# Patient Record
Sex: Female | Born: 1960 | Race: Black or African American | Hispanic: No | Marital: Married | Smoking: Never smoker
Health system: Southern US, Community
[De-identification: ages and names within clinical notes are randomized; demographics above are authoritative.]

## PROBLEM LIST (undated history)

## (undated) DIAGNOSIS — J40 Bronchitis, not specified as acute or chronic: Secondary | ICD-10-CM

## (undated) HISTORY — PX: ABDOMINAL HYSTERECTOMY: SHX81

## (undated) HISTORY — PX: KNEE SURGERY: SHX244

---

## 2013-04-25 ENCOUNTER — Encounter (HOSPITAL_COMMUNITY): Payer: Self-pay | Admitting: Emergency Medicine

## 2013-04-25 ENCOUNTER — Emergency Department (INDEPENDENT_AMBULATORY_CARE_PROVIDER_SITE_OTHER)
Admission: EM | Admit: 2013-04-25 | Discharge: 2013-04-25 | Disposition: A | Discharge status: Home / Self Care | Payer: Self-pay | Source: Home / Self Care | Attending: Family Medicine | Admitting: Family Medicine

## 2013-04-25 ENCOUNTER — Emergency Department (INDEPENDENT_AMBULATORY_CARE_PROVIDER_SITE_OTHER): Payer: Self-pay

## 2013-04-25 DIAGNOSIS — J45909 Unspecified asthma, uncomplicated: Secondary | ICD-10-CM

## 2013-04-25 MED ORDER — AZITHROMYCIN 250 MG PO TABS: ORAL_TABLET | ORAL | Status: DC

## 2013-04-25 MED ORDER — ACETAMINOPHEN 325 MG PO TABS
ORAL_TABLET | ORAL | Status: AC
  Filled 2013-04-25: qty 2

## 2013-04-25 MED ORDER — IPRATROPIUM BROMIDE 0.02 % IN SOLN
0.5000 mg | Freq: Once | RESPIRATORY_TRACT | Status: AC
  Administered 2013-04-25: 0.5 mg via RESPIRATORY_TRACT

## 2013-04-25 MED ORDER — PREDNISONE 20 MG PO TABS
ORAL_TABLET | ORAL | Status: AC
  Filled 2013-04-25: qty 2

## 2013-04-25 MED ORDER — AZITHROMYCIN 250 MG PO TABS
ORAL_TABLET | ORAL | Status: AC
  Filled 2013-04-25: qty 2

## 2013-04-25 MED ORDER — PREDNISONE 10 MG PO TABS: ORAL_TABLET | ORAL | Status: DC

## 2013-04-25 MED ORDER — ALBUTEROL SULFATE HFA 108 (90 BASE) MCG/ACT IN AERS: 1.0000 | INHALATION_SPRAY | RESPIRATORY_TRACT | Status: DC | PRN

## 2013-04-25 MED ORDER — ACETAMINOPHEN 325 MG PO TABS
650.0000 mg | ORAL_TABLET | Freq: Once | ORAL | Status: AC
  Administered 2013-04-25: 650 mg via ORAL

## 2013-04-25 MED ORDER — ALBUTEROL SULFATE (2.5 MG/3ML) 0.083% IN NEBU
5.0000 mg | INHALATION_SOLUTION | Freq: Once | RESPIRATORY_TRACT | Status: AC
Start: 2013-04-25 — End: 2013-04-25
  Administered 2013-04-25: 5 mg via RESPIRATORY_TRACT

## 2013-04-25 MED ORDER — IPRATROPIUM BROMIDE 0.02 % IN SOLN
RESPIRATORY_TRACT | Status: AC
  Filled 2013-04-25: qty 2.5

## 2013-04-25 MED ORDER — PREDNISONE 20 MG PO TABS
40.0000 mg | ORAL_TABLET | Freq: Once | ORAL | Status: AC
  Administered 2013-04-25: 40 mg via ORAL

## 2013-04-25 MED ORDER — ALBUTEROL SULFATE (2.5 MG/3ML) 0.083% IN NEBU
INHALATION_SOLUTION | RESPIRATORY_TRACT | Status: AC
  Filled 2013-04-25: qty 6

## 2013-04-25 MED ORDER — IPRATROPIUM BROMIDE 0.06 % NA SOLN: 2.0000 | Freq: Four times a day (QID) | NASAL | Status: DC

## 2013-04-25 MED ORDER — AZITHROMYCIN 250 MG PO TABS
500.0000 mg | ORAL_TABLET | Freq: Once | ORAL | Status: AC
  Administered 2013-04-25: 500 mg via ORAL

## 2013-04-25 NOTE — ED Notes (Signed)
Went to get pt for CXR, was receiving breathing treatment

## 2013-04-25 NOTE — ED Notes (Signed)
C/o URI type symptoms since 1st week in month, cough, wheezing, , rumbling in her chest. Minimal relief w OTC medications

## 2013-04-25 NOTE — ED Notes (Signed)
Breathing treatment continues. 

## 2013-04-25 NOTE — ED Provider Notes (Signed)
CSN: [id number]     Arrival date & time 04/25/13  [id number] History   First MD Initiated Contact with Patient 04/25/13 [phone]     Chief Complaint  Patient presents with  . URI   (Consider location/radiation/quality/duration/timing/severity/associated sxs/prior Treatment) HPI Comments: Non-smoker PCP: none Works in Product/process development scientistmechandising  Patient is a 53 y.o. female presenting with URI. The history is provided by the patient.  URI Presenting symptoms: congestion, cough, fatigue and rhinorrhea   Severity:  Moderate Onset quality:  Gradual Duration:  2 weeks Timing:  Constant Progression:  Worsening Chronicity:  New Associated symptoms: wheezing   Associated symptoms: no arthralgias, no headaches, no myalgias, no neck pain, no sinus pain and no sneezing   Associated symptoms comment:  +dyspnea   History reviewed. No pertinent past medical history. Past Surgical History  Procedure Laterality Date  . Abdominal hysterectomy    . Knee surgery     History reviewed. No pertinent family history. History  Substance Use Topics  . Smoking status: Never Smoker   . Smokeless tobacco: Not on file  . Alcohol Use: No   OB History   Grav Para Term Preterm Abortions TAB SAB Ect Mult Living                 Review of Systems  Constitutional: Positive for chills and fatigue.  HENT: Positive for congestion and rhinorrhea. Negative for sneezing.   Eyes: Negative.   Respiratory: Positive for cough, shortness of breath and wheezing.   Cardiovascular: Negative.   Gastrointestinal: Negative.   Genitourinary: Negative.   Musculoskeletal: Negative for arthralgias, myalgias and neck pain.  Neurological: Negative for headaches.    Allergies  Sulfa antibiotics  Home Medications   Current Outpatient Rx  Name  Route  Sig  Dispense  Refill  . albuterol (PROVENTIL HFA;VENTOLIN HFA) 108 (90 BASE) MCG/ACT inhaler   Inhalation   Inhale 1-2 puffs into the lungs every 4 (four) hours as needed for wheezing or  shortness of breath.   1 Inhaler   0   . azithromycin (ZITHROMAX) 250 MG tablet      Beginning 04/26/2013, take 1 po QD x 4 days   4 tablet   0   . predniSONE (DELTASONE) 10 MG tablet      Beginning 04/26/2013, Take 3 po QD day 1, 2 tabs po QD day 2, 1 tab po QD days 3 & 4 then stop.   7 tablet   0    BP 183/94  Pulse 102  Temp(Src) 100.6 F (38.1 C) (Oral)  Resp 22  SpO2 93% Physical Exam  Nursing note and vitals reviewed. Constitutional: She is oriented to person, place, and time. She appears well-developed and well-nourished. No distress.  HENT:  Head: Normocephalic and atraumatic.  Right Ear: Hearing, tympanic membrane, external ear and ear canal normal.  Left Ear: Hearing, tympanic membrane, external ear and ear canal normal.  Nose: Nose normal.  Mouth/Throat: Uvula is midline, oropharynx is clear and moist and mucous membranes are normal.  Eyes: Conjunctivae are normal.  Neck: Neck supple.  Cardiovascular: Normal rate, regular rhythm and normal heart sounds.   Pulmonary/Chest: She has decreased breath sounds. She has wheezes. She has no rhonchi. She has no rales.  Musculoskeletal: Normal range of motion.  Lymphadenopathy:    She has no cervical adenopathy.  Neurological: She is alert and oriented to person, place, and time.  Skin: Skin is warm and dry. No rash noted.  Psychiatric: She has a normal mood  and affect. Her behavior is normal.    ED Course  Procedures (including critical care time) Labs Review Labs Reviewed - No data to display Imaging Review Dg Chest 2 View  04/25/2013   CLINICAL DATA:  Upper respiratory infection. Shortness of breath, wheezing, chest congestion, dizziness, and fever.  EXAM: CHEST  2 VIEW  COMPARISON:  None.  FINDINGS: The cardiomediastinal silhouette is within normal limits. The lungs are well inflated without evidence of focal airspace consolidation, edema, pleural effusion, or pneumothorax. No acute osseous abnormality is  identified.  IMPRESSION: No active cardiopulmonary disease.   Electronically Signed   By: Sebastian Ache   On: 04/25/2013 11:38     MDM   1. Asthmatic bronchitis    Asthmatic Bronchitis: No CAP. Will treat with bronchodilators, 5 day course of azithromycin, and short course of prednisone. Patient advised regarding elevated blood pressure and that this should be re-checked by the PCP of her choice in 7-10 days. Maintain good hydration and tylenol as directed on packaging for aches/fever. Cautioned about need to return if no improvement in increased work of breathing.  Patient reports significant reduction in wheezing and work of breathing following albuterol/atrovent neb at [facility]. Requests assistance in the form of a prescription for excessive post nasal drainage. Will provide Rx for atrovent nasal spray.   Jess Barters [place], [place] 04/25/13 [address] [place], [place] 04/25/13 [phone]

## 2013-04-25 NOTE — Discharge Instructions (Signed)

## 2013-04-26 ENCOUNTER — Encounter (HOSPITAL_COMMUNITY): Payer: Self-pay | Admitting: Emergency Medicine

## 2013-04-26 ENCOUNTER — Emergency Department (HOSPITAL_COMMUNITY)
Admission: EM | Admit: 2013-04-26 | Discharge: 2013-04-26 | Disposition: A | Discharge status: Home / Self Care | Payer: Self-pay | Attending: Emergency Medicine | Admitting: Emergency Medicine

## 2013-04-26 ENCOUNTER — Emergency Department (HOSPITAL_COMMUNITY): Payer: Self-pay

## 2013-04-26 DIAGNOSIS — R1110 Vomiting, unspecified: Secondary | ICD-10-CM | POA: Insufficient documentation

## 2013-04-26 DIAGNOSIS — R42 Dizziness and giddiness: Secondary | ICD-10-CM | POA: Insufficient documentation

## 2013-04-26 DIAGNOSIS — J209 Acute bronchitis, unspecified: Secondary | ICD-10-CM

## 2013-04-26 DIAGNOSIS — J3489 Other specified disorders of nose and nasal sinuses: Secondary | ICD-10-CM | POA: Insufficient documentation

## 2013-04-26 DIAGNOSIS — R0602 Shortness of breath: Secondary | ICD-10-CM

## 2013-04-26 DIAGNOSIS — R509 Fever, unspecified: Secondary | ICD-10-CM | POA: Insufficient documentation

## 2013-04-26 HISTORY — DX: Bronchitis, not specified as acute or chronic: J40

## 2013-04-26 LAB — CBC
HEMATOCRIT: 38.4 % (ref 36.0–46.0)
Hemoglobin: 13.1 g/dL (ref 12.0–15.0)
MCH: 30.8 pg (ref 26.0–34.0)
MCHC: 34.1 g/dL (ref 30.0–36.0)
MCV: 90.1 fL (ref 78.0–100.0)
Platelets: 247 10*3/uL (ref 150–400)
RBC: 4.26 MIL/uL (ref 3.87–5.11)
RDW: 14.2 % (ref 11.5–15.5)
WBC: 5 10*3/uL (ref 4.0–10.5)

## 2013-04-26 LAB — BASIC METABOLIC PANEL
BUN: 17 mg/dL (ref 6–23)
CHLORIDE: 100 meq/L (ref 96–112)
CO2: 24 mEq/L (ref 19–32)
Calcium: 9.3 mg/dL (ref 8.4–10.5)
Creatinine, Ser: 0.95 mg/dL (ref 0.50–1.10)
GFR calc Af Amer: 77 mL/min — ABNORMAL LOW (ref >90)
GFR calc non Af Amer: 67 mL/min — ABNORMAL LOW (ref >90)
GLUCOSE: 133 mg/dL — AB (ref 70–99)
POTASSIUM: 4 meq/L (ref 3.7–5.3)
Sodium: 139 mEq/L (ref 137–147)

## 2013-04-26 LAB — PRO B NATRIURETIC PEPTIDE: Pro B Natriuretic peptide (BNP): 89.5 pg/mL (ref 0–125)

## 2013-04-26 LAB — D-DIMER, QUANTITATIVE (NOT AT ARMC): D DIMER QUANT: 1.58 ug{FEU}/mL — AB (ref 0.00–0.48)

## 2013-04-26 LAB — I-STAT TROPONIN, ED: Troponin i, poc: 0.02 ng/mL (ref 0.00–0.08)

## 2013-04-26 MED ORDER — ACETAMINOPHEN 325 MG PO TABS
650.0000 mg | ORAL_TABLET | Freq: Once | ORAL | Status: AC
  Administered 2013-04-26: 650 mg via ORAL
  Filled 2013-04-26: qty 2

## 2013-04-26 MED ORDER — ALBUTEROL SULFATE (2.5 MG/3ML) 0.083% IN NEBU
5.0000 mg | INHALATION_SOLUTION | Freq: Once | RESPIRATORY_TRACT | Status: AC
  Administered 2013-04-26: 5 mg via RESPIRATORY_TRACT
  Filled 2013-04-26: qty 6

## 2013-04-26 MED ORDER — HYDROCOD POLST-CHLORPHEN POLST 10-8 MG/5ML PO LQCR
5.0000 mL | Freq: Once | ORAL | Status: AC
  Administered 2013-04-26: 5 mL via ORAL
  Filled 2013-04-26: qty 5

## 2013-04-26 MED ORDER — LEVALBUTEROL HCL 0.63 MG/3ML IN NEBU
0.6300 mg | INHALATION_SOLUTION | Freq: Once | RESPIRATORY_TRACT | Status: AC
  Administered 2013-04-26: 0.63 mg via RESPIRATORY_TRACT
  Filled 2013-04-26: qty 3

## 2013-04-26 MED ORDER — IPRATROPIUM BROMIDE 0.02 % IN SOLN
0.5000 mg | Freq: Once | RESPIRATORY_TRACT | Status: AC
  Administered 2013-04-26: 0.5 mg via RESPIRATORY_TRACT
  Filled 2013-04-26: qty 2.5

## 2013-04-26 MED ORDER — IOHEXOL 350 MG/ML SOLN
80.0000 mL | Freq: Once | INTRAVENOUS | Status: AC | PRN
  Administered 2013-04-26: 80 mL via INTRAVENOUS

## 2013-04-26 MED ORDER — HYDROCOD POLST-CHLORPHEN POLST 10-8 MG/5ML PO LQCR: 5.0000 mL | Freq: Two times a day (BID) | ORAL | Status: DC | PRN

## 2013-04-26 NOTE — ED Notes (Signed)
Pt states that she is having a hard time catching her breath after having a coughing fit. States that her inhaler makes her cough and so she doesn't want to use it.

## 2013-04-26 NOTE — ED Notes (Addendum)
C/o sob x 1 week.  States [facility] and diagnosed with bronch[facility].  Did not pick up her inhaler from pharmacy earlier due to cost.  Pt went to Pharmacy at 2am to get inhaler and states sob worse after using inhaler.  Reports non-productive cough.

## 2013-04-26 NOTE — ED Provider Notes (Signed)
CSN: [id number]     Arrival date & time 04/26/13  0259 History   First MD Initiated Contact with Patient 04/26/13 [phone]     Chief Complaint  Patient presents with  . Shortness of Breath     (Consider location/radiation/quality/duration/timing/severity/associated sxs/prior Treatment) HPI 53 yo female presents with  Shortness of breath that started yesterday after being seen at [facility] and given breathing treatment for her "labored Breathing" that started on Monday, 5 days ago. Patient also admit to cough that started yesterday. Patient states she was started on prednisone, antibiotic, and inhaler. Patient states the inhaler gives her a "coughing spasm", so she has not used it since the initial use. Patient admits to worsening cough when she lays flat. SOB improved when she stands up. Patient denies any chest pain. Patient admits to fever, posttussive vomiting, lightheadedness when she becomes short of breath.   Age > 12 yo: Yes HR > 100 bpm: Yes O2 sat on RA < 95%: Yes Prior hx of venous thromboembolism:No Trauma or surgery in past 4 wks:No Hemoptysis:No Exogenous Estrogen use:No Unilateral Leg swelling: No Pre tests probability for PE < 15%:No      Past Medical History  Diagnosis Date  . Bronchitis    Past Surgical History  Procedure Laterality Date  . Abdominal hysterectomy    . Knee surgery     No family history on file. History  Substance Use Topics  . Smoking status: Never Smoker   . Smokeless tobacco: Not on file  . Alcohol Use: No   OB History   Grav Para Term Preterm Abortions TAB SAB Ect Mult Living                 Review of Systems  Constitutional: Positive for fever. Negative for chills.  HENT: Positive for congestion.   Respiratory: Positive for cough (nonproductive) and shortness of breath.   Cardiovascular: Negative for chest pain, palpitations and leg swelling.  Gastrointestinal: Positive for vomiting. Negative for abdominal pain.  All other systems  reviewed and are negative.      Allergies  Sulfa antibiotics  Home Medications   Current Outpatient Rx  Name  Route  Sig  Dispense  Refill  . Multiple Vitamin (MULTIVITAMIN WITH MINERALS) TABS tablet   Oral   Take 1 tablet by mouth daily.         . chlorpheniramine-HYDROcodone (TUSSIONEX PENNKINETIC ER) 10-8 MG/5ML LQCR   Oral   Take 5 mLs by mouth every 12 (twelve) hours as needed for cough (Cough).   115 mL   0    BP 100/53  Pulse 96  Temp(Src) 99.4 F (37.4 C) (Oral)  Resp 20  Wt 278 lb 7 oz (126.298 kg)  SpO2 100% Physical Exam  Nursing note and vitals reviewed. Constitutional: She is oriented to person, place, and time. She appears well-developed and well-nourished. No distress.  HENT:  Head: Normocephalic and atraumatic.  Nose: Mucosal edema present.  Mouth/Throat: Uvula is midline, oropharynx is clear and moist and mucous membranes are normal. No oropharyngeal exudate, posterior oropharyngeal edema or posterior oropharyngeal erythema.  Eyes: Conjunctivae are normal. Right eye exhibits no discharge. Left eye exhibits no discharge. No scleral icterus.  Neck: Phonation normal. Neck supple. No JVD present. No rigidity. No tracheal deviation, no edema and no erythema present.  Cardiovascular: Normal rate and regular rhythm.  Exam reveals no gallop and no friction rub.   No murmur heard. Pulmonary/Chest: Effort normal and breath sounds normal. No stridor. No respiratory  distress. She has no wheezes. She has no rhonchi. She has no rales.  Musculoskeletal: Normal range of motion. She exhibits no edema.  Neurological: She is alert and oriented to person, place, and time.  Skin: Skin is warm and dry. She is not diaphoretic.  Psychiatric: She has a normal mood and affect. Her behavior is normal.    ED Course  Procedures (including critical care time) Labs Review Labs Reviewed  BASIC METABOLIC PANEL - Abnormal; Notable for the following:    Glucose, Bld 133 (*)     GFR calc non Af Amer 67 (*)    GFR calc Af Amer 77 (*)    All other components within normal limits  D-DIMER, QUANTITATIVE - Abnormal; Notable for the following:    D-Dimer, Quant 1.58 (*)    All other components within normal limits  CBC  PRO B NATRIURETIC PEPTIDE  I-STAT TROPOININ, ED   Imaging Review Dg Chest 2 View  04/26/2013   CLINICAL DATA:  Cough for 1 day, shortness of breath, history of pneumonia  EXAM: CHEST  2 VIEW  COMPARISON:  04/25/2013  FINDINGS: The heart size and mediastinal contours are within normal limits. No acute infiltrate or pulmonary edema. Central mild bronchitic changes. Mild degenerative changes thoracic spine.  IMPRESSION: No acute infiltrate or pulmonary edema. Central mild bronchitic changes.   Electronically Signed   By: Natasha MeadLiviu  Pop M.D.   On: 04/26/2013 09:15   Dg Chest 2 View  04/25/2013   CLINICAL DATA:  Upper respiratory infection. Shortness of breath, wheezing, chest congestion, dizziness, and fever.  EXAM: CHEST  2 VIEW  COMPARISON:  None.  FINDINGS: The cardiomediastinal silhouette is within normal limits. The lungs are well inflated without evidence of focal airspace consolidation, edema, pleural effusion, or pneumothorax. No acute osseous abnormality is identified.  IMPRESSION: No active cardiopulmonary disease.   Electronically Signed   By: Sebastian AcheAllen  Grady   On: 04/25/2013 11:38   Ct Angio Chest Pe W/cm &/or Wo Cm  04/26/2013   CLINICAL DATA:  Shortness of Breath, elevated D-dimer  EXAM: CT ANGIOGRAPHY CHEST WITH CONTRAST  TECHNIQUE: Multidetector CT imaging of the chest was performed using the standard protocol during bolus administration of intravenous contrast. Multiplanar CT image reconstructions and MIPs were obtained to evaluate the vascular anatomy.  CONTRAST:  80mL OMNIPAQUE IOHEXOL 350 MG/ML SOLN  COMPARISON:  None.  FINDINGS: Satisfactory opacification of pulmonary arteries noted, and there is no evidence of pulmonary emboli. Adequate contrast  opacification of the thoracic aorta with no evidence of dissection, aneurysm, or stenosis. There is bovine brachiocephalic arch anatomy without proximal stenosis. No pleural or pericardial effusion. No hilar or mediastinal adenopathy. Patchy ground-glass opacities in both lung apices. Linear subsegmental atelectasis in the anterior basal segment left lower lobe and posterior basal segment right lower lobe. Visualized portions of upper abdomen unremarkable. Bridging osteophytes across multiple contiguous levels in the mid and lower thoracic spine.  Review of the MIP images confirms the above findings.  IMPRESSION: 1. Negative for acute PE to or thoracic aortic dissection. 2. Patchy ground-glass opacities in both lung apices, nonspecific.   Electronically Signed   By: Oley Balmaniel  Hassell M.D.   On: 04/26/2013 11:23     EKG Interpretation   Date/Time:  Saturday April 26 2013 03:03:11 EST Ventricular Rate:  84 PR Interval:  150 QRS Duration: 80 QT Interval:  348 QTC Calculation: 411 R Axis:   17 Text Interpretation:  Normal sinus rhythm Nonspecific ST and T wave  abnormality Abnormal ECG No previous ECGs available Confirmed by CAMPOS   MD, KEVIN ([id number]) on 04/26/2013 8:10:56 AM      MDM   Final diagnoses:  Shortness of breath  Bronchospasm with bronchitis, acute    Patient with low grade fever on admission. Improved with tylenol in ED.  Troponin negative No leukocytosis. BNP negative CXR shows mild bronchitic changes. No acute infiltrate or edema D-Dimer positive at 1.58  CT angio shows bilateral ground glass opacities. No evidence of PE or Dissection.   Discussed lab and exam findings with patient. Patient currently on appropriate therapy. Patient advised to continue antibiotics, steroids, and inhaler as directed. Plan to treat patient's cough and have her follow up with her pcp in 2 days. Patient given return precautions for any worsening symptoms.   Meds given in ED:  Medications   albuterol (PROVENTIL) (2.5 MG/3ML) 0.083% nebulizer solution 5 mg (5 mg Nebulization Given 04/26/13 0342)  levalbuterol (XOPENEX) nebulizer solution 0.63 mg (0.63 mg Nebulization Given 04/26/13 0741)  chlorpheniramine-HYDROcodone (TUSSIONEX) 10-8 MG/5ML suspension 5 mL (5 mLs Oral Given 04/26/13 0737)  acetaminophen (TYLENOL) tablet 650 mg (650 mg Oral Given 04/26/13 0743)  albuterol (PROVENTIL) (2.5 MG/3ML) 0.083% nebulizer solution 5 mg (5 mg Nebulization Given 04/26/13 0816)  ipratropium (ATROVENT) nebulizer solution 0.5 mg (0.5 mg Nebulization Given 04/26/13 0816)  chlorpheniramine-HYDROcodone (TUSSIONEX) 10-8 MG/5ML suspension 5 mL (5 mLs Oral Given 04/26/13 0939)  iohexol (OMNIPAQUE) 350 MG/ML injection 80 mL (80 mLs Intravenous Contrast Given 04/26/13 1105)    Discharge Medication List as of 04/26/2013 11:38 AM    START taking these medications   Details  chlorpheniramine-HYDROcodone (TUSSIONEX PENNKINETIC ER) 10-8 MG/5ML LQCR Take 5 mLs by mouth every 12 (twelve) hours as needed for cough (Cough)., Starting 04/26/2013, Until Discontinued, Print            Allen Norris [place], [place] 04/27/13 [phone]

## 2013-04-26 NOTE — Discharge Instructions (Signed)
Follow up with pulmonology as soon as possible. Take cough medication as directed. Continue current antibiotics and steroids as directed. Return to emergency department if your symptoms should worsen, you develop worsening shortness of breath, chest pain, or fever/chills.

## 2013-04-27 NOTE — ED Provider Notes (Signed)
Medical screening examination/treatment/procedure(s) were performed by a resident physician or non-physician practitioner and as the supervising physician I was immediately available for consultation/collaboration.  Diamantina Edinger, MD    Alysson Geist S Courtnay Petrilla, MD 04/27/13 0843 

## 2013-04-27 NOTE — ED Provider Notes (Signed)
Medical screening examination/treatment/procedure(s) were performed by non-physician practitioner and as supervising physician I was immediately available for consultation/collaboration.   EKG Interpretation   Date/Time:  Saturday April 26 2013 03:03:11 EST Ventricular Rate:  84 PR Interval:  150 QRS Duration: 80 QT Interval:  348 QTC Calculation: 411 R Axis:   17 Text Interpretation:  Normal sinus rhythm Nonspecific ST and T wave  abnormality Abnormal ECG No previous ECGs available Confirmed by CAMPOS   MD, Caryn BeeKEVIN ([id number]) on 04/26/2013 8:10:56 AM       Loren Raceravid Marquetta Weiskopf, MD 04/27/13 [phone]

## 2013-08-05 ENCOUNTER — Ambulatory Visit: Payer: Self-pay

## 2017-01-11 ENCOUNTER — Ambulatory Visit: Payer: Non-veteran care | Attending: Internal Medicine | Admitting: Physical Therapy

## 2017-01-11 ENCOUNTER — Encounter: Payer: Self-pay | Admitting: Physical Therapy

## 2017-01-11 DIAGNOSIS — M545 Low back pain, unspecified: Secondary | ICD-10-CM

## 2017-01-11 DIAGNOSIS — G8929 Other chronic pain: Secondary | ICD-10-CM | POA: Diagnosis present

## 2017-01-11 DIAGNOSIS — M25551 Pain in right hip: Secondary | ICD-10-CM | POA: Insufficient documentation

## 2017-01-11 DIAGNOSIS — R262 Difficulty in walking, not elsewhere classified: Secondary | ICD-10-CM | POA: Diagnosis present

## 2017-01-11 DIAGNOSIS — M6281 Muscle weakness (generalized): Secondary | ICD-10-CM | POA: Insufficient documentation

## 2017-01-11 NOTE — Therapy (Signed)
[facility] [address] [place], [place], [address] Phone: [phone]   Fax:  [phone]  Physical Therapy Evaluation  Patient Details  Name: Connie Cortez MRN: [id number] Date of Birth: 1961-01-15 Referring Provider: Daisy Floro   Encounter Date: 01/11/2017  PT End of Session - 01/11/17 1545    Visit Number  1    Number of Visits  13    Date for PT Re-Evaluation  02/23/17    Authorization Type  [facility]- to see PT only    PT Start Time  1543    PT Stop Time  1630    PT Time Calculation (min)  47 min    Activity Tolerance  Patient tolerated treatment well    Behavior During Therapy  [facility] for tasks assessed/performed       Past Medical History:  Diagnosis Date  . Bronchitis     Past Surgical History:  Procedure Laterality Date  . ABDOMINAL HYSTERECTOMY    . KNEE SURGERY      There were no vitals filed for this visit.   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 Feels that she did not address it properly at that time. continued to work in physical environment. Pain has worsened progressively. R leg is beginning to drag and keeps stumbling. Has always had a pain in R lower back and numbness in toes. After about 4-5 hours of being out, she is unable to clear/lift R leg to get into car. Did some PT end of last year/beg of this year that consisted of DN, deep tissue and modalities; felt it was helpful but continues to have pain.     How long can you sit comfortably?  15 min    How long can you stand comfortably?  30 min    Currently in Pain?  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      Restrictions   Weight Bearing Restrictions  No      Balance Screen   Has the patient fallen in the past 6 months  No      Home Environment   Living Environment  Private residence    Living Arrangements  Spouse/significant other    Additional Comments  steps to enter home      Prior Function   Vocation  Part time employment    Vocation Requirements  post office      Cognition   Overall Cognitive Status  Within Functional Limits for tasks assessed      Observation/Other Assessments   Focus on Therapeutic Outcomes (FOTO)  74% limited      Sensation   Additional Comments  loss of sensation R 2nd & 3rd toes; weakness of Rt leg      Posture/Postural Control   Posture Comments  increased lordosis with anterior pelvic tilt, sits leaning to Lt      Palpation   Palpation comment  TTP Rt glut/SIJ             Objective measurements completed on examination: See above findings.      [facility] Adult PT Treatment/Exercise - 01/11/17 0001      Exercises   Exercises  Lumbar      Lumbar Exercises: Stretches   Single Knee to Chest Stretch  4 reps    Pelvic Tilt Limitations  paired with breathing, supine & seated      Lumbar Exercises: Standing   Other Standing Lumbar Exercises  glut sets             PT Education - 01/11/17 1722    Education provided  Yes    Education Details  anatomy of condition- explanation of surgical rationale, exercise form/rationale, HEP, POC    Person(s) Educated  Patient    Methods  Explanation;Demonstration;Tactile cues;Verbal cues;Handout    Comprehension  Verbalized understanding;Need further instruction;Returned  demonstration;Verbal cues required;Tactile cues required          PT Long Term Goals - 01/11/17 1730      PT LONG TERM GOAL #1   Title  FOTO to 52% limitation to indicate significant improvement in functional ability    Baseline  74% limitation at eval    Time  6    Period  Weeks    Status  New    Target Date  02/23/17      PT LONG TERM GOAL #2   Title  Pt will verbalize ability to utilize core/gluts for postural alignment to improve standing endurance    Baseline  began educating at eval and will strengthen    Time  6    Period  Weeks    Status  New    Target Date  02/23/17      PT LONG TERM GOAL #3   Title  Pt will be able to clear leg to lift into car after work day/being up and about    Baseline  unable after a few hours at eval    Time  6    Period  Weeks    Status  New    Target Date  02/23/17      PT LONG TERM GOAL #4   Title  Pt will be able to don shoes without increased LBP    Baseline  extremely difficult at eval    Time  6    Period  Weeks    Status  New    Target Date  02/23/17             Plan - 01/11/17 1723    Clinical Impression Statement  Pt presents to PT with complaints of LBP that extends into her Rt leg and is worsening. Beginning to feel a loss of strength and control in Rt leg causing stumbling. Discussion held today to answer questions about surgical interventions and rationale, explaining anatomy of changes as well. Pt would like to avoid surgery and we discussed that symptoms will be monitored and it may be necessary, she was agreeable. Pt wil notable increase in lumbar lordosis with anterior pelvic tilt resulting in increased extension compression to stenosis.  Educated on proper alignment and use of core/gluts to provide support to spine. pt will benefit from skilled PT in order to improve lumbopelvic alignment and stability to reduce pain and support biomechanical chain.     History and Personal Factors relevant to plan of care:   obesity, h/o hysterectomy    Clinical Presentation  Evolving    Clinical Decision Making  Moderate    Rehab Potential  Good    PT Frequency  2x / week    PT Duration  6 weeks    PT Treatment/Interventions  Therapeutic exercise;Neuromuscular re-education;Dry needling;Gait training;Taping;Manual techniques;Therapeutic activities;ADLs/Self Care Home Management;Electrical Stimulation;Moist Heat;Cryotherapy    PT Next Visit Plan  nu step UE & LE, stenosis-flexion bias, core strengthening    PT Home Exercise Plan  abdominal engagement, glut sets, SKTC    Consulted and Agree with Plan of Care  Patient       Patient will benefit from skilled therapeutic intervention in order to improve the following deficits and impairments:  Impaired sensation, Improper body mechanics, Pain, Postural dysfunction, Increased muscle spasms, Decreased activity tolerance, Decreased strength, Obesity, Difficulty walking  Visit Diagnosis: Chronic right-sided low back pain without sciatica - Plan: PT plan of care cert/re-cert  Pain in right hip - Plan: PT plan of care cert/re-cert  Difficulty in walking, not elsewhere classified - Plan: PT plan of care cert/re-cert  Muscle weakness (generalized) - Plan: PT plan of care cert/re-cert  G-Codes - 01/11/17 1735    Functional Assessment Tool Used (Outpatient Only)  FOTO 74% limited, clinical judgement    Functional Limitation  Mobility: Walking and moving around    Mobility: Walking and Moving Around Current Status ([id number])  At least 60 percent but less than 80 percent impaired, limited or restricted    Mobility: Walking and Moving Around Goal Status (A2130(G8979)  At least 40 percent but less than 60 percent impaired, limited or restricted        Problem List There are no active problems to display for this patient.   Marquasha Brutus C. Kailany Dinunzio PT, DPT 01/11/17 5:40 PM   [facility] [address] [place], [place],  [id number] Phone: [phone]   Fax:  [phone]  Name: Connie Cortez MRN: [id number] Date of Birth: 07/01/1960

## 2017-01-23 ENCOUNTER — Encounter: Payer: Self-pay | Admitting: Physical Therapy

## 2017-01-23 ENCOUNTER — Ambulatory Visit: Payer: Non-veteran care | Admitting: Physical Therapy

## 2017-01-23 DIAGNOSIS — M545 Low back pain: Principal | ICD-10-CM

## 2017-01-23 DIAGNOSIS — M6281 Muscle weakness (generalized): Secondary | ICD-10-CM

## 2017-01-23 DIAGNOSIS — R262 Difficulty in walking, not elsewhere classified: Secondary | ICD-10-CM

## 2017-01-23 DIAGNOSIS — M25551 Pain in right hip: Secondary | ICD-10-CM

## 2017-01-23 DIAGNOSIS — G8929 Other chronic pain: Secondary | ICD-10-CM

## 2017-01-23 NOTE — Therapy (Signed)
[facility] [address] [place], [place], [address] Phone: [phone]   Fax:  [phone]  Physical Therapy Treatment  Patient Details  Name: Connie PatienceJulia Francis-Romney MRN: [id number] Date of Birth: Jul 28, 1960 Referring Provider: Daisy FloroAntoinette Wymer   Encounter Date: 01/23/2017  PT End of Session - 01/23/17 1548    Visit Number  2    Number of Visits  13    Date for PT Re-Evaluation  02/23/17    Authorization Type  [facility]- to see PT only    PT Start Time  1548    PT Stop Time  1630    PT Time Calculation (min)  42 min    Activity Tolerance  Patient tolerated treatment well    Behavior During Therapy  [facility] for tasks assessed/performed       Past Medical History:  Diagnosis Date  . Bronchitis     Past Surgical History:  Procedure Laterality Date  . ABDOMINAL HYSTERECTOMY    . KNEE SURGERY      There were no vitals filed for this visit.  Subjective Assessment - 01/23/17 1551    Subjective  Back is okay, it could be better. Feels Rt sacroilliac joint. Toes feel numb. Glut sets help when standing and feeling "out of wack"    Currently in Pain?  Yes    Pain Score  6     Pain Location  Back    Pain Orientation  Right;Lower    Pain Descriptors / Indicators  Aching    Pain Frequency  Constant                      [facility] Adult PT Treatment/Exercise - 01/23/17 0001      Lumbar Exercises: Stretches   Single Knee to Chest Stretch  2 reps;10 seconds both    Piriformis Stretch  Other (comment) hip ER stretch      Lumbar Exercises: Seated   Other Seated Lumbar Exercises  press ball into thigh with trunk twist    Other Seated Lumbar Exercises  seated trunk twist      Lumbar Exercises: Supine   Large Ball Abdominal Isometric  10 reps;5 seconds    Large Ball Oblique Isometric  10 reps;5 seconds      Manual Therapy   Manual Therapy  Soft tissue mobilization    Manual therapy comments  edu on use of tennis ball on  wall    Soft tissue mobilization  IASTM & trigger point release R glut max                  PT Long Term Goals - 01/11/17 1730      PT LONG TERM GOAL #1   Title  FOTO to 52% limitation to indicate significant improvement in functional ability    Baseline  74% limitation at eval    Time  6    Period  Weeks    Status  New    Target Date  02/23/17      PT LONG TERM GOAL #2   Title  Pt will verbalize ability to utilize core/gluts for postural alignment to improve standing endurance    Baseline  began educating at eval and will strengthen    Time  6    Period  Weeks    Status  New    Target Date  02/23/17      PT LONG TERM GOAL #3   Title  Pt will be able to  clear leg to lift into car after work day/being up and about    Baseline  unable after a few hours at eval    Time  6    Period  Weeks    Status  New    Target Date  02/23/17      PT LONG TERM GOAL #4   Title  Pt will be able to don shoes without increased LBP    Baseline  extremely difficult at eval    Time  6    Period  Weeks    Status  New    Target Date  02/23/17            Plan - 01/23/17 1630    Clinical Impression Statement  Pt verbalized concordant pain reproduced with palpation to trigger points. Toes felt sleepy rather than numb following. Core exercises to train appropraite use of obliques and will continue to progress to train functional activities.     PT Treatment/Interventions  Therapeutic exercise;Neuromuscular re-education;Dry needling;Gait training;Taping;Manual techniques;Therapeutic activities;ADLs/Self Care Home Management;Electrical Stimulation;Moist Heat;Cryotherapy    PT Next Visit Plan  flexion bias-stenosis, DN R glut max    PT Home Exercise Plan  abdominal engagement, glut sets, SKTC; hip ER stretch, seated trunk rotation    Consulted and Agree with Plan of Care  Patient       Patient will benefit from skilled therapeutic intervention in order to improve the following  deficits and impairments:  Impaired sensation, Improper body mechanics, Pain, Postural dysfunction, Increased muscle spasms, Decreased activity tolerance, Decreased strength, Obesity, Difficulty walking  Visit Diagnosis: Chronic right-sided low back pain without sciatica  Pain in right hip  Difficulty in walking, not elsewhere classified  Muscle weakness (generalized)     Problem List There are no active problems to display for this patient.  Kemyah Buser C. Shifa Brisbon PT, DPT 01/23/17 4:33 PM   [facility] [address] [place], [place], [address] Phone: [phone]   Fax:  [phone]  Name: Connie PatienceJulia Francis-Romney MRN: [id number] Date of Birth: 16-Jan-1961

## 2017-01-25 ENCOUNTER — Ambulatory Visit: Payer: Non-veteran care | Admitting: Physical Therapy

## 2017-01-25 ENCOUNTER — Encounter: Payer: Self-pay | Admitting: Physical Therapy

## 2017-01-25 DIAGNOSIS — G8929 Other chronic pain: Secondary | ICD-10-CM

## 2017-01-25 DIAGNOSIS — M6281 Muscle weakness (generalized): Secondary | ICD-10-CM

## 2017-01-25 DIAGNOSIS — M545 Low back pain, unspecified: Secondary | ICD-10-CM

## 2017-01-25 DIAGNOSIS — M25551 Pain in right hip: Secondary | ICD-10-CM

## 2017-01-25 DIAGNOSIS — R262 Difficulty in walking, not elsewhere classified: Secondary | ICD-10-CM

## 2017-01-25 NOTE — Patient Instructions (Addendum)
Trigger Point Dry Needling  . What is Trigger Point Dry Needling (DN)? o DN is a physical therapy technique used to treat muscle pain and dysfunction. Specifically, DN helps deactivate muscle trigger points (muscle knots).  o A thin filiform needle is used to penetrate the skin and stimulate the underlying trigger point. The goal is for a local twitch response (LTR) to occur and for the trigger point to relax. No medication of any kind is injected during the procedure.   . What Does Trigger Point Dry Needling Feel Like?  o The procedure feels different for each individual patient. Some patients report that they do not actually feel the needle enter the skin and overall the process is not painful. Very mild bleeding may occur. However, many patients feel a deep cramping in the muscle in which the needle was inserted. This is the local twitch response.   Marland Kitchen. How Will I feel after the treatment? o Soreness is normal, and the onset of soreness may not occur for a few hours. Typically this soreness does not last longer than two days.  o Bruising is uncommon, however; ice can be used to decrease any possible bruising.  o In rare cases feeling tired or nauseous after the treatment is normal. In addition, your symptoms may get worse before they get better, this period will typically not last longer than 24 hours.   . What Can I do After My Treatment? o Increase your hydration by drinking more water for the next 24 hours. o You may place ice or heat on the areas treated that have become sore, however, do not use heat on inflamed or bruised areas. Heat often brings more relief post needling. o You can continue your regular activities, but vigorous activity is not recommended initially after the treatment for 24 hours. o DN is best combined with other physical therapy such as strengthening, stretching, and other therapies.   Pt given information about quadratus lumborum and left sidelying quadratus lumborum  stretch.    Garen LahLawrie Beardsley, PT Certified Exercise Expert for the Aging Adult  01/25/17 4:04 PM Phone: [phone] Fax: [phone]

## 2017-01-25 NOTE — Therapy (Signed)
[facility] [address] [place], [place], [address] Phone: [phone]   Fax:  [phone]  Physical Therapy Treatment  Patient Details  Name: Connie Cortez MRN: [id number] Date of Birth: [birth year] Referring Provider: Daisy FloroAntoinette Wymer   Encounter Date: 01/25/2017  PT End of Session - 01/25/17 1642    Visit Number  3    Number of Visits  13    Date for PT Re-Evaluation  02/23/17    Authorization Type  [facility]- to see PT only    PT Start Time  1355 35 minute RX    PT Stop Time  1440    PT Time Calculation (min)  45 min    Activity Tolerance  Patient tolerated treatment well    Behavior During Therapy  [facility] LLCWFL for tasks assessed/performed       Past Medical History:  Diagnosis Date  . Bronchitis     Past Surgical History:  Procedure Laterality Date  . ABDOMINAL HYSTERECTOMY    . KNEE SURGERY      There were no vitals filed for this visit.  Subjective Assessment - 01/25/17 1554    Subjective  I am feeling like an 7./10 and I have numbness on my 2nd and 3rd digits and the ball of my foot.  I cannot sit for long because my toes " go" cold    Currently in Pain?  Yes    Pain Score  7     Pain Location  Back    Pain Orientation  Right;Lower    Pain Descriptors / Indicators  Aching    Pain Frequency  Constant         [facility] PT Assessment - 01/25/17 1600      Posture/Postural Control   Posture Comments  right pelvic level elevated with sacral pelvic shearing on right                  [facility] Adult PT Treatment/Exercise - 01/25/17 1638      Posture/Postural Control   Posture Comments  right pelvic level elevated with sacral pelvic shearing on right      Self-Care   Self-Care  Other Self-Care Comments    Other Self-Care Comments   education on TPDN , precautians and aftercare      Lumbar Exercises: Sidelying   Other Sidelying Lumbar Exercises  quadratus lumborum left sidelying stretch during TPDN and  instructed to do at home for shortened QL      Lumbar Exercises: Prone   Other Prone Lumbar Exercises  childs pose stretch 30 sec x3 and then to left for right QL stretch 3 x 30 sec      Manual Therapy   Manual Therapy  Soft tissue mobilization;Myofascial release    Soft tissue mobilization  Gluts and piriformis and lumbosacral with IASTYM    Myofascial Release  right Quadratus lumborum       Trigger Point Dry Needling - 01/25/17 1559    Consent Given?  Yes    Education Handout Provided  Yes    Muscles Treated Upper Body  Quadratus Lumborum right only  Right paraspinal of L-4/L5  L5 - S1    Muscles Treated Lower Body  Gluteus minimus;Gluteus maximus;Piriformis    Gluteus Maximus Response  Twitch response elicited;Palpable increased muscle length    Gluteus Minimus Response  Twitch response elicited;Palpable increased muscle length    Piriformis Response  Twitch response elicited;Palpable increased muscle length  PT Education - 01/25/17 1609    Education provided  Yes    Education Details  Education on TPDN and given HEP for quadratus lumborum stretch  left sidelying and childs pose  Handout with Quadratus lumborum   Person(s) Educated  Patient    Methods  Explanation;Demonstration;Tactile cues;Verbal cues    Comprehension  Returned demonstration;Verbalized understanding          PT Long Term Goals - 01/11/17 1730      PT LONG TERM GOAL #1   Title  FOTO to 52% limitation to indicate significant improvement in functional ability    Baseline  74% limitation at eval    Time  6    Period  Weeks    Status  New    Target Date  02/23/17      PT LONG TERM GOAL #2   Title  Pt will verbalize ability to utilize core/gluts for postural alignment to improve standing endurance    Baseline  began educating at eval and will strengthen    Time  6    Period  Weeks    Status  New    Target Date  02/23/17      PT LONG TERM GOAL #3   Title  Pt will be able to clear leg to  lift into car after work day/being up and about    Baseline  unable after a few hours at eval    Time  6    Period  Weeks    Status  New    Target Date  02/23/17      PT LONG TERM GOAL #4   Title  Pt will be able to don shoes without increased LBP    Baseline  extremely difficult at eval    Time  6    Period  Weeks    Status  New    Target Date  02/23/17            Plan - 01/25/17 1644    Clinical Impression Statement  Pt presents with pain at 7/10 in right side back and requests TPDN for RX today.  Pt also reports numbness in balls of right foot and 2nd and 3rd digits.  Pt consented to TPDN and was closely monitored throughout session. Pt recieved educaton concerning precuatians and aftercare.  Pt tolerated well with several Localized twitch responses over riight Quadrauts lumborum and right gluts and piriformis. Pt reports feeling better after RX but no specific number given       Patient will benefit from skilled therapeutic intervention in order to improve the following deficits and impairments:     Visit Diagnosis: Chronic right-sided low back pain without sciatica  Pain in right hip  Difficulty in walking, not elsewhere classified  Muscle weakness (generalized)     Problem List There are no active problems to display for this patient.   Connie Cortez, PT Certified Exercise Expert for the Aging Adult  01/25/17 4:54 PM Phone: [phone] Fax: [phone]  [facility] [address] [place], [place], [address] Phone: [phone]   Fax:  [phone]  Name: Connie Cortez MRN: [id number] Date of Birth: [birth year]

## 2017-01-30 ENCOUNTER — Ambulatory Visit: Payer: Non-veteran care | Admitting: Physical Therapy

## 2017-02-01 ENCOUNTER — Encounter: Payer: Self-pay | Admitting: Physical Therapy

## 2017-02-01 ENCOUNTER — Ambulatory Visit: Payer: Non-veteran care | Attending: Internal Medicine | Admitting: Physical Therapy

## 2017-02-01 DIAGNOSIS — M25551 Pain in right hip: Secondary | ICD-10-CM

## 2017-02-01 DIAGNOSIS — M545 Low back pain, unspecified: Secondary | ICD-10-CM

## 2017-02-01 DIAGNOSIS — R262 Difficulty in walking, not elsewhere classified: Secondary | ICD-10-CM | POA: Diagnosis present

## 2017-02-01 DIAGNOSIS — M6281 Muscle weakness (generalized): Secondary | ICD-10-CM | POA: Diagnosis present

## 2017-02-01 DIAGNOSIS — G8929 Other chronic pain: Secondary | ICD-10-CM | POA: Diagnosis present

## 2017-02-01 NOTE — Patient Instructions (Addendum)
Sleeping on Back  Place pillow under knees. A pillow with cervical support and a roll around waist are also helpful. Copyright  VHI. All rights reserved.  Sleeping on Side Place pillow between knees. Use cervical support under neck and a roll around waist as needed. Copyright  VHI. All rights reserved.   Sleeping on Stomach   If this is the only desirable sleeping position, place pillow under lower legs, and under stomach or chest as needed.  Posture - Sitting   Sit upright, head facing forward. Try using a roll to support lower back. Keep shoulders relaxed, and avoid rounded back. Keep hips level with knees. Avoid crossing legs for long periods. Stand to Sit / Sit to Stand   To sit: Bend knees to lower self onto front edge of chair, then scoot back on seat. To stand: Reverse sequence by placing one foot forward, and scoot to front of seat. Use rocking motion to stand up.   Work Height and Reach  Ideal work height is no more than 2 to 4 inches below elbow level when standing, and at elbow level when sitting. Reaching should be limited to arm's length, with elbows slightly bent.  Bending  Bend at hips and knees, not back. Keep feet shoulder-width apart.    Posture - Standing   Good posture is important. Avoid slouching and forward head thrust. Maintain curve in low back and align ears over shoul- ders, hips over ankles.  Alternating Positions   Alternate tasks and change positions frequently to reduce fatigue and muscle tension. Take rest breaks. Computer Work   Position work to face forward. Use proper work and seat height. Keep shoulders back and down, wrists straight, and elbows at right angles. Use chair that provides full back support. Add footrest and lumbar roll as needed.  Getting Into / Out of Car  Lower self onto seat, scoot back, then bring in one leg at a time. Reverse sequence to get out.  Dressing  Lie on back to pull socks or slacks over feet, or sit  and bend leg while keeping back straight.    Housework - Sink  Place one foot on ledge of cabinet under sink when standing at sink for prolonged periods.   Pushing / Pulling  Pushing is preferable to pulling. Keep back in proper alignment, and use leg muscles to do the work.  Deep Squat   Squat and lift with both arms held against upper trunk. Tighten stomach muscles without holding breath. Use smooth movements to avoid jerking.  Avoid Twisting   Avoid twisting or bending back. Pivot around using foot movements, and bend at knees if needed when reaching for articles.  Carrying Luggage   Distribute weight evenly on both sides. Use a cart whenever possible. Do not twist trunk. Move body as a unit.   Lifting Principles .Maintain proper posture and head alignment. .Slide object as close as possible before lifting. .Move obstacles out of the way. .Test before lifting; ask for help if too heavy. .Tighten stomach muscles without holding breath. .Use smooth movements; do not jerk. .Use legs to do the work, and pivot with feet. .Distribute the work load symmetrically and close to the center of trunk. .Push instead of pull whenever possible.   Ask For Help   Ask for help and delegate to others when possible. Coordinate your movements when lifting together, and maintain the low back curve.  Log Roll   Lying on back, bend left knee and place left   arm across chest. Roll all in one movement to the right. Reverse to roll to the left. Always move as one unit. Housework - Sweeping  Use long-handled equipment to avoid stooping.   Housework - Wiping  Position yourself as close as possible to reach work surface. Avoid straining your back.  Laundry - Unloading Wash   To unload small items at bottom of washer, lift leg opposite to arm being used to reach.  Gardening - Raking  Move close to area to be raked. Use arm movements to do the work. Keep back straight and avoid  twisting.     Cart  When reaching into cart with one arm, lift opposite leg to keep back straight.   Getting Into / Out of Bed  Lower self to lie down on one side by raising legs and lowering head at the same time. Use arms to assist moving without twisting. Bend both knees to roll onto back if desired. To sit up, start from lying on side, and use same move-ments in reverse. Housework - Vacuuming  Hold the vacuum with arm held at side. Step back and forth to move it, keeping head up. Avoid twisting.   Laundry - Armed forces training and education officerLoading Wash  Position laundry basket so that bending and twisting can be avoided.   Laundry - Unloading Dryer  Squat down to reach into clothes dryer or use a reacher.  Gardening - Weeding / Psychiatric nurselanting  Squat or Kneel. Knee pads may be helpful.                   Bridge   Lie back, legs bent. Inhale, pressing hips up. Keeping ribs in, lengthen lower back. Exhale, rolling down along spine from top. Repeat __15__ times. Do __2__ sessions per day.  May use a ball to squeeze between knees.  Copyright  VHI. All rights reserved.   Pelvic Tilt   Flatten back by tightening stomach muscles and buttocks. Repeat _10___ times per set. Do __1__ sets per session. Do __2__ sessions per day.  http://orth.exer.us/134   Copyright  VHI. All rights reserved. Knee to Chest (Flexion)   Pull knee toward chest. Feel stretch in lower back or buttock area. Breathing deeply, Hold _10___ seconds. Repeat with other knee. Repeat __5__ times. Do __1-2__ sessions per day.  http://gt2.exer.us/225   Copyright  VHI. All rights reserved.   Lower Trunk Rotation Stretch   Keeping back flat and feet together, rotate knees to left side. Hold __5__ seconds. Repeat __10__ times per set. Do __1__ sets per session. Do _1-2___ sessions per day.  http://orth.exer.us/122   Copyright  VHI. All rights reserved.  Supine: Leg Stretch With Strap (Basic)   Lie on back with one knee  bent, foot flat on floor. Hook strap around other foot. Straighten knee. Keep knee level with other knee. Hold 30___ seconds. Relax leg completely down to floor.  Repeat 3___ times per session. Do _1-2__ sessions per day.  Copyright  VHI. All rights reserved.  Standing Arch (Extension)   Place hands in small of back. Using hands as fulcrum, arch backward. Try to keep knees straight. Great exercise if sitting makes pain worse. Use to break up long periods of sitting. Repeat _5___ times every hour at work. Hold for 5 sec.   Do __1-2__ sessions per day.  Flexors, Supine Bridge   HIP: Flexors - Supine   Lie on edge of surface. Place leg off the surface, allow knee to bend. Bring other knee toward chest. Hold  30_ seconds. _3__ reps per set, 1___ sets per day, _7__ days per week Rest lowered foot on stool.       Garen LahLawrie Beardsley, PT Certified Exercise Expert for the Aging Adult  02/01/17 4:03 PM Phone: [phone] Fax: [phone]

## 2017-02-01 NOTE — Therapy (Signed)
[facility] [address] [place], [place], [address] Phone: [phone]   Fax:  [phone]  Physical Therapy Treatment  Patient Details  Name: Connie Cortez MRN: [id number] Date of Birth: Mar 13, 1960 Referring Provider: Daisy Floro   Encounter Date: 02/01/2017  PT End of Session - 02/01/17 1639    Visit Number  4    Number of Visits  13    Date for PT Re-Evaluation  02/23/17    Authorization Type  [facility]- to see PT only    PT Start Time  0345    PT Stop Time  0443    PT Time Calculation (min)  58 min    Activity Tolerance  Patient tolerated treatment well    Behavior During Therapy  [facility] for tasks assessed/performed       Past Medical History:  Diagnosis Date  . Bronchitis     Past Surgical History:  Procedure Laterality Date  . ABDOMINAL HYSTERECTOMY    . KNEE SURGERY      There were no vitals filed for this visit.  Subjective Assessment - 02/01/17 1550    Subjective  I am doing better with sitting better in my seat.  and doing the stretches have made a difference.  I am sore on both sides about a 6/10 better than 7/10 last week.    Currently in Pain?  Yes    Pain Score  6     Pain Location  Back    Pain Orientation  Right;Left    Pain Descriptors / Indicators  Aching;Sore    Pain Frequency  Constant                      [facility] Adult PT Treatment/Exercise - 02/01/17 1604      Self-Care   Self-Care  ADL's;Lifting;Posture    ADL's  being able utilize stool at work to Occupational hygienist at trunk levele instead of above head    Lifting  went over verbally    Posture  aware of posture during the day.  using handouts to go over daily  postural challenges      Lumbar Exercises: Stretches   Passive Hamstring Stretch  30 seconds;2 reps bil stretch out strap    Single Knee to Chest Stretch  5 reps;10 seconds    Lower Trunk Rotation  10 seconds 10 times    Pelvic Tilt  10 seconds 5reps  bil    Quadruped Mid Back Stretch  2 reps;30 seconds      Lumbar Exercises: Standing   Other Standing Lumbar Exercises  standing quadratus lumborum stretch agains wall and in sitting  30 sec each      Lumbar Exercises: Supine   Bridge  10 reps 1/2 range      Lumbar Exercises: Sidelying   Other Sidelying Lumbar Exercises  quadratus lumborum left sidelying stretch during TPDN and instructed to do at home for shortened QL      Modalities   Modalities  Moist Heat      Moist Heat Therapy   Number Minutes Moist Heat  15 Minutes    Moist Heat Location  Lumbar Spine      Manual Therapy   Manual Therapy  Soft tissue mobilization    Soft tissue mobilization  IASTYM for bil Quadratus , gluteal and  piriformis all bil       Trigger Point Dry Needling - 02/01/17 1607    Consent Given?  Yes  Education Handout Provided  Yes verbally    Muscles Treated Upper Body  Quadratus Lumborum bil    Muscles Treated Lower Body  Gluteus minimus;Gluteus maximus;Piriformis    Gluteus Maximus Response  Twitch response elicited;Palpable increased muscle length    Gluteus Minimus Response  Twitch response elicited;Palpable increased muscle length    Piriformis Response  Twitch response elicited;Palpable increased muscle length                PT Long Term Goals - 02/01/17 1640      PT LONG TERM GOAL #1   Title  FOTO to 52% limitation to indicate significant improvement in functional ability    Baseline  74% limitation at eval    Time  6    Period  Weeks    Status  Unable to assess      PT LONG TERM GOAL #2   Title  Pt will verbalize ability to utilize core/gluts for postural alignment to improve standing endurance    Baseline  Pt more aware of work station duties and the role of posture     Time  6    Period  Weeks    Status  On-going      PT LONG TERM GOAL #3   Title  Pt will be able to clear leg to lift into car after work day/being up and about    Baseline  improving motor control  in exercises    Time  6    Period  Weeks    Status  On-going      PT LONG TERM GOAL #4   Title  Pt will be able to don shoes without increased LBP    Baseline  Pt able to have more range but not completing the task as of yet    Time  6    Period  Weeks    Status  On-going            Plan - 02/01/17 1609    Clinical Impression Statement  Pt present siwth pain at 6.10 and is making progress towards goals but has not completed any goasl as of yet.  Pt benefits form TPDN and was closely monitored througtout session today.  Pt with several localized twitch responses especially in gluteals today. Pt tolerated well and was progressed to increasing exercises for HEP.      Rehab Potential  Good    PT Frequency  2x / week    PT Duration  6 weeks    PT Treatment/Interventions  Therapeutic exercise;Neuromuscular re-education;Dry needling;Gait training;Taping;Manual techniques;Therapeutic activities;ADLs/Self Care Home Management;Electrical Stimulation;Moist Heat;Cryotherapy    PT Next Visit Plan  dead bug isometric abd hold with ball,  pt with flexion bias and stenosis    PT Home Exercise Plan  abdominal engagement, glut sets, SKTC; hip ER stretch, seated trunk rotation, hip flexor stretch, bridging, trunk rotation    Consulted and Agree with Plan of Care  Patient       Patient will benefit from skilled therapeutic intervention in order to improve the following deficits and impairments:  Impaired sensation, Improper body mechanics, Pain, Postural dysfunction, Increased muscle spasms, Decreased activity tolerance, Decreased strength, Obesity, Difficulty walking  Visit Diagnosis: Chronic right-sided low back pain without sciatica  Pain in right hip  Difficulty in walking, not elsewhere classified  Muscle weakness (generalized)     Problem List There are no active problems to display for this patient.   Garen LahLawrie Teyla Skidgel, PT Certified Exercise Expert for  the Aging Adult  02/01/17  4:46 PM Phone: [phone] Fax: [phone]  [facility] [address] [place], [place], [address] Phone: [phone]   Fax:  [phone]  Name: Connie Cortez MRN: [id number] Date of Birth: 04/09/1960

## 2017-02-06 ENCOUNTER — Ambulatory Visit: Payer: Non-veteran care | Admitting: Physical Therapy

## 2017-02-06 DIAGNOSIS — M545 Low back pain: Principal | ICD-10-CM

## 2017-02-06 DIAGNOSIS — R262 Difficulty in walking, not elsewhere classified: Secondary | ICD-10-CM

## 2017-02-06 DIAGNOSIS — G8929 Other chronic pain: Secondary | ICD-10-CM

## 2017-02-06 DIAGNOSIS — M25551 Pain in right hip: Secondary | ICD-10-CM

## 2017-02-06 DIAGNOSIS — M6281 Muscle weakness (generalized): Secondary | ICD-10-CM

## 2017-02-06 NOTE — Therapy (Signed)
[facility] [address] [place], [place], [address] Phone: [phone]   Fax:  [phone]  Physical Therapy Treatment  Patient Details  Name: Connie Cortez MRN: [id number] Date of Birth: [birth year] Referring Provider: Daisy FloroAntoinette Wymer   Encounter Date: 02/06/2017  PT End of Session - 02/06/17 1546    Visit Number  5    Number of Visits  13    Date for PT Re-Evaluation  02/23/17    Authorization Type  [facility]- to see PT only    PT Start Time  1545    PT Stop Time  1628    PT Time Calculation (min)  43 min    Activity Tolerance  Patient tolerated treatment well    Behavior During Therapy  [facility] for tasks assessed/performed       Past Medical History:  Diagnosis Date  . Bronchitis     Past Surgical History:  Procedure Laterality Date  . ABDOMINAL HYSTERECTOMY    . KNEE SURGERY      There were no vitals filed for this visit.  Subjective Assessment - 02/06/17 1548    Subjective  The Dry needling has really helped.  I have to think about how I sit. 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     Currently in Pain?  Yes    Pain Score  5     Pain Location  Back    Pain Orientation  Right;Left    Pain Descriptors / Indicators  Tightness    Pain Type  Chronic pain    Pain Onset  More than a month ago    Pain Frequency  Intermittent    Aggravating Factors   walk and sit, too long     Pain Relieving Factors  paying attention to how i move, walk, sit , sacral pillow , taking breaks          [facility] Adult PT Treatment/Exercise - 02/06/17 0001      Self-Care   Lifting  work activities    Posture  sitting and standing, stenosis     Other Self-Care Comments   HEP       Lumbar Exercises: Stretches   Single Knee to Chest Stretch  5 reps;10 seconds    Lower Trunk Rotation  2 reps;30 seconds    Pelvic Tilt  10 seconds x 10 abs    Piriformis Stretch  2 reps;30  seconds      Lumbar Exercises: Aerobic   Stationary Bike  L2 , 5 min       Lumbar Exercises: Supine   Ab Set  10 reps slight PPT     Bent Knee Raise  10 reps    Bridge  10 reps 1/2 range    Other Supine Lumbar Exercises  standing ball press for iso core       Lumbar Exercises: Sidelying   Other Sidelying Lumbar Exercises  QL stretch       Manual Therapy   Soft tissue mobilization  Rt. QL, lumbar paraspinals and glute med              PT Education - 02/06/17 1628    Education provided  Yes    Education Details  L stab, suggestions for work, pushing cart, importance of core , spinal stenosis     Person(s) Educated  Patient    Methods  Explanation;Handout    Comprehension  Verbalized understanding;Returned demonstration          PT Long Term Goals - 02/01/17 1640      PT LONG TERM GOAL #1   Title  FOTO to 52% limitation to indicate significant improvement in functional ability    Baseline  74% limitation at eval    Time  6    Period  Weeks    Status  Unable to assess      PT LONG TERM GOAL #2   Title  Pt will verbalize ability to utilize core/gluts for postural alignment to improve standing endurance    Baseline  Pt more aware of work station duties and the role of posture     Time  6    Period  Weeks    Status  On-going      PT LONG TERM GOAL #3   Title  Pt will be able to clear leg to lift into car after work day/being up and about    Baseline  improving motor control in exercises    Time  6    Period  Weeks    Status  On-going      PT LONG TERM GOAL #4   Title  Pt will be able to don shoes without increased LBP    Baseline  Pt able to have more range but not completing the task as of yet    Time  6    Period  Weeks    Status  On-going            Plan - 02/06/17 1630    Clinical Impression Statement  Pt with subjective report of progress, feels her HEP is effective and she is more aware of her everyday habits, patterns.  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  Stressed importance of core strength for safety.     PT Next Visit Plan  core, pt with flexion bias and stenosis    PT Home Exercise Plan  abdominal engagement, glut sets, SKTC; hip ER stretch, seated trunk rotation, hip flexor stretch, bridging, trunk rotation, L stab     Consulted and Agree with Plan of Care  Patient       Patient will benefit from skilled therapeutic intervention in order to improve the following deficits and impairments:  Impaired sensation, Improper body mechanics, Pain, Postural dysfunction, Increased muscle spasms, Decreased activity tolerance, Decreased strength, Obesity, Difficulty walking  Visit Diagnosis: Chronic right-sided low back pain without sciatica  Pain in right hip  Difficulty in walking, not elsewhere classified  Muscle weakness (generalized)     Problem List There are no active problems to display for this patient.   PAA,JENNIFER 02/06/2017, 4:37 PM  [facility] [address] [place], [place], [address] Phone: [phone]   Fax:  [phone]  Name: Connie Cortez MRN: [id number] Date of Birth: 1960-04-08  Karie MainlandJennifer Paa, PT 02/06/17 4:37 PM Phone: [phone] Fax: [phone]

## 2017-02-08 ENCOUNTER — Encounter: Payer: Self-pay | Admitting: Physical Therapy

## 2017-02-08 ENCOUNTER — Ambulatory Visit: Payer: Non-veteran care | Admitting: Physical Therapy

## 2017-02-08 DIAGNOSIS — M6281 Muscle weakness (generalized): Secondary | ICD-10-CM

## 2017-02-08 DIAGNOSIS — G8929 Other chronic pain: Secondary | ICD-10-CM

## 2017-02-08 DIAGNOSIS — R262 Difficulty in walking, not elsewhere classified: Secondary | ICD-10-CM

## 2017-02-08 DIAGNOSIS — M545 Low back pain: Principal | ICD-10-CM

## 2017-02-08 DIAGNOSIS — M25551 Pain in right hip: Secondary | ICD-10-CM

## 2017-02-08 NOTE — Patient Instructions (Addendum)
Hip Abduction: Side-Lying (Single Leg)    Lie on side with knees bent, tubing around thighs just above knees. Raise top leg, keeping knee bent. Repeat _15_ times per set. Repeat on other side. Do _2_ sets per session. Do _1_ sessions per week.  http://tub.exer.us/44      Knee High   Holding stable object, raise knee to hip level, then lower knee. Repeat with other knee. Complete __15 repetitions. Do __2__ sessions per day.  ABDUCTION: Standing (Active)   Stand, feet flat. Lift right leg out to side. Use _0__ lbs. can Complete __15 repetitions. Perform __2_ sessions per day.  ADDUCTION: Standing - Stable (Active)   Stand, right leg out to side as far as possible. Draw leg in across midline. Use _0__ lbs. Complete 15_ repetitions. Perform _2__ sessions per day.       EXTENSION: Standing (Active)  Stand, both feet flat. Draw right leg behind body as far as possible. Use 0___ lbs. Complete 10 repetitions. Perform __2_ sessions per day.  Copyright  VHI. All rights reserved.      Garen LahLawrie Gael Londo, PT Certified Exercise Expert for the Aging Adult  02/08/17 4:17 PM Phone: [phone] Fax: [phone]

## 2017-02-08 NOTE — Therapy (Addendum)
[facility] [address] [place], [place], [address] Phone: [phone]   Fax:  [phone]  Physical Therapy Treatment  Patient Details  Name: Connie Cortez MRN: [id number] Date of Birth: Jan 02, 1961 Referring Provider: Daisy FloroAntoinette Wymer   Encounter Date: 02/08/2017  PT End of Session - 02/08/17 1550    Visit Number  6   Number of Visits  13    Date for PT Re-Evaluation  02/23/17    Authorization Type  [facility]- to see PT only    PT Start Time  1550    PT Stop Time  1647    PT Time Calculation (min)  57 min    Activity Tolerance  Patient tolerated treatment well    Behavior During Therapy  [facility] for tasks assessed/performed       Past Medical History:  Diagnosis Date  . Bronchitis     Past Surgical History:  Procedure Laterality Date  . ABDOMINAL HYSTERECTOMY    . KNEE SURGERY      There were no vitals filed for this visit.  Subjective Assessment - 02/08/17 1556    Subjective  I am really tender in my low back especially on my right buttock    Currently in Pain?  Yes    Pain Score  5     Pain Location  Back    Pain Orientation  Right;Left    Pain Descriptors / Indicators  Tightness    Pain Type  Chronic pain    Pain Onset  More than a month ago    Pain Frequency  Intermittent                      [facility] Adult PT Treatment/Exercise - 02/08/17 1602      Lumbar Exercises: Stretches   Piriformis Stretch  2 reps;30 seconds      Lumbar Exercises: Aerobic   Stationary Bike  -- Nu step 3 minutes  level 3      Lumbar Exercises: Standing   Other Standing Lumbar Exercises  standing 4 way SLR flex( marching) ext, add, abd and extension x 10 with 2 lb wts    Other Standing Lumbar Exercises  standing goblet squats x 10      Lumbar Exercises: Supine   Ab Set  10 reps    Bent Knee Raise  10 reps    Bridge  10 reps 1/2 range, 1/2 range with 5 lb wt too x 5    Other Supine Lumbar Exercises  standing   ball press for iso core       Lumbar Exercises: Sidelying   Other Sidelying Lumbar Exercises  QL stretch  verbalized and reinforced correct position.      Modalities   Modalities  --      Moist Heat Therapy   Moist Heat Location  --      Cryotherapy   Number Minutes Cryotherapy  12 Minutes    Cryotherapy Location  Lumbar Spine right side QL buttock    Type of Cryotherapy  Ice pack             PT Education - 02/08/17 1617    Education provided  Yes    Education Details  added strengthening of buttock and hip and pain/low back HEP    Person(s) Educated  Patient    Methods  Explanation;Demonstration;Tactile cues;Verbal cues;Handout    Comprehension  Verbalized understanding;Returned demonstration          PT Long  Term Goals - 02/08/17 1617      PT LONG TERM GOAL #1   Title  FOTO to 52% limitation to indicate significant improvement in functional ability    Baseline  74% limitation at eval    Time  6    Period  Weeks    Status  Unable to assess      PT LONG TERM GOAL #2   Title  Pt will verbalize ability to utilize core/gluts for postural alignment to improve standing endurance    Baseline  Pt 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  Pt tends to enter car with one foot instead of backing into seat.  Pt reports she will try to utilize good body mechanics    Time  6    Period  Weeks    Status  On-going      PT LONG TERM GOAL #4   Title  Pt will be able to don shoes without increased LBP    Baseline  Pt still leans down to put on shoes with pain in buttocks about 5/10    Time  6    Period  Weeks    Status  On-going            Plan - 02/08/17 1641    Clinical Impression Statement  Pt  complains of tenderness over buttock ( glut medius and along iliac crest of Right buttock)  Pt/ PT decided to continue with strengthening and to fore go TPDN today.  Pt was given strengthening HEP today.  Pt gait is better with increased hip flexion and decreased stumbling and tripping on feet.  Making progress towards goals but no goals achieved   Rehab Potential  Good    PT Frequency  2x / week    PT Duration  6 weeks    PT Treatment/Interventions  Therapeutic exercise;Neuromuscular re-education;Dry needling;Gait training;Taping;Manual techniques;Therapeutic activities;ADLs/Self Care Home Management;Electrical Stimulation;Moist Heat;Cryotherapy    PT Next Visit Plan  core, pt with flexion bias and stenosis  Wayland Denis will do Re eval on 12/20   PT Home Exercise Plan  abdominal engagement, glut sets, SKTC; hip ER stretch, seated trunk rotation, hip flexor stretch, bridging, trunk rotation, L stab standing SLR 4 way, clams with red t band  and sit to stand with and without goblet squat    Consulted and Agree with Plan of Care  Patient       Patient will benefit from skilled therapeutic intervention in order to improve the following deficits and impairments:  Impaired sensation, Improper body mechanics, Pain, Postural dysfunction, Increased muscle spasms, Decreased activity tolerance, Decreased strength, Obesity, Difficulty walking  Visit Diagnosis: Chronic right-sided low back pain without sciatica  Pain in right hip  Difficulty in walking, not elsewhere classified  Muscle weakness (generalized)     Problem List There are no active problems to display for this patient.  Garen Lah, PT Certified Exercise Expert for the Aging Adult  02/08/17 4:54 PM Phone: [phone] Fax: [phone]  [facility] [address]  [address] [place], [place], [address] Phone: [phone]   Fax:  [phone]  Name: Connie Cortez MRN: [id number] Date  of Birth: 01-16-1961

## 2017-02-13 ENCOUNTER — Encounter: Payer: No Typology Code available for payment source | Admitting: Physical Therapy

## 2017-02-14 ENCOUNTER — Telehealth: Payer: Self-pay | Admitting: Physical Therapy

## 2017-02-14 ENCOUNTER — Ambulatory Visit: Payer: Non-veteran care | Admitting: Physical Therapy

## 2017-02-14 NOTE — Telephone Encounter (Signed)
Left message regarding her missed appt today at 8:45, reminded her of her next appt tomorrow at 3:45.  Karie MainlandJennifer Paa, PT 02/14/17 9:22 AM Phone: [phone] Fax: [phone]

## 2017-02-15 ENCOUNTER — Ambulatory Visit: Payer: Non-veteran care | Admitting: Physical Therapy

## 2017-02-15 ENCOUNTER — Encounter: Payer: Self-pay | Admitting: Physical Therapy

## 2017-02-15 DIAGNOSIS — M6281 Muscle weakness (generalized): Secondary | ICD-10-CM

## 2017-02-15 DIAGNOSIS — M25551 Pain in right hip: Secondary | ICD-10-CM

## 2017-02-15 DIAGNOSIS — M545 Low back pain, unspecified: Secondary | ICD-10-CM

## 2017-02-15 DIAGNOSIS — R262 Difficulty in walking, not elsewhere classified: Secondary | ICD-10-CM

## 2017-02-15 DIAGNOSIS — G8929 Other chronic pain: Secondary | ICD-10-CM

## 2017-02-15 NOTE — Patient Instructions (Addendum)
  Copyright  VHI. All rights reserved.  Side Twist, Kneeling   Kneel, buttocks on heels. Fold upper body forward from hips. Then reach to each side as far as possible, keeping chest low toward floor. Hold each position _30__ seconds. Repeat __3_ times per session. Do __2_ sessions per day.    Copyright  VHI. All rights reserved.   Garen LahLawrie Tenzin Edelman, PT Certified Exercise Expert for the Aging Adult  02/15/17 4:38 PM Phone: [phone] Fax: [phone]

## 2017-02-15 NOTE — Therapy (Signed)
[facility] [address] [place], [place], [address] Phone: [phone]   Fax:  [phone]  Physical Therapy Treatment  Patient Details  Name: Connie Cortez MRN: [id number] Date of Birth: 1960/11/21 Referring Provider: Daisy FloroAntoinette Wymer   Encounter Date: 02/15/2017  PT End of Session - 02/15/17 1643    Visit Number  7    Number of Visits  13    Date for PT Re-Evaluation  02/23/17    Authorization Type  [facility]- to see PT only    PT Start Time  1546    PT Stop Time  1646    PT Time Calculation (min)  60 min    Activity Tolerance  Patient tolerated treatment well    Behavior During Therapy  [facility] for tasks assessed/performed       Past Medical History:  Diagnosis Date  . Bronchitis     Past Surgical History:  Procedure Laterality Date  . ABDOMINAL HYSTERECTOMY    . KNEE SURGERY      There were no vitals filed for this visit.  Subjective Assessment - 02/15/17 1551    Subjective  My day starts at a 5/10.  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 try to do quad stretching I really try to do my exercises.     Limitations  Sitting;Standing;House hold activities    How long can you sit comfortably?  45 minutes before I need to shift    How long can you stand comfortably?  30 minutes    Currently in Pain?  Yes    Pain Score  7     Pain Location  Back    Pain Orientation  Right;Left    Pain Descriptors / Indicators  Tightness    Pain Type  Chronic pain    Pain Onset  More than a month ago    Pain Frequency  Constant         [facility] PT Assessment - 02/15/17 0001      Assessment   Medical Diagnosis  back pain and spinal stenosis  Right SI joint pain     Precautions   Precaution Comments  central canal stenosis at L4/5                  [facility] Treatment/Exercise - 02/15/17 1705      Self-Care   Other Self-Care Comments   discussion of  community wellness, yoga and movement classes education with spine on stenosis and SIJ pain      Lumbar Exercises: Aerobic   Stationary Bike  L 4, 6 minutes      Lumbar Exercises: Standing   Other Standing Lumbar Exercises  standing pelvic tilt by wall 10 x 2       Lumbar Exercises: Supine   Ab Set  10 reps    Bent Knee Raise  10 reps    Bridge  10 reps 1/2 range and with ball squeeze x 10      Bridge Limitations  use of sheet to assist bridge eccentrically by PT with sheet 5 x    Other Supine Lumbar Exercises  use of dowel for R SIJ resistance of hamstirng and left quad rsistance 5 times 10 seconds each      Lumbar Exercises: Quadruped   [place]/Old Horse  10 reps x 2    Straight Leg Raise  15 reps x2    Other Quadruped  Lumbar Exercises  quadriped lumbar stretch forward x 3 and to right and left by 3 each 30 sec stretch      Cryotherapy   Cryotherapy Location  Lumbar Spine;Hip right             PT Education - 02/15/17 1640    Education provided  Yes    Education Details  added SIJ and hip strengthening to HEP    Person(s) Educated  Patient    Methods  Explanation;Demonstration;Tactile cues;Verbal cues;Handout    Comprehension  Verbalized understanding;Returned demonstration          PT Long Term Goals - 02/15/17 1714      PT LONG TERM GOAL #1   Title  FOTO to 52% limitation to indicate significant improvement in functional ability    Baseline  71% on  02-15-17  improved 3 % from eval    Time  6    Period  Weeks    Status  On-going      PT LONG TERM GOAL #2   Title  Pt will verbalize ability to utilize core/gluts for postural alignment to improve standing endurance    Baseline  Pt today finally was able to understand pelvic tilt and is trying to use core / gluts for postural aligment but is very weak in gluts    Time  6    Period  Weeks    Status  On-going      PT LONG TERM GOAL #3   Title  Pt will be able to clear leg to lift into car after work day/being  up and about    Baseline  Pt using better technique  but still feels stiff when arising from car    Time  6    Period  Weeks    Status  On-going      PT LONG TERM GOAL #4   Title  Pt will be able to don shoes without increased LBP    Baseline  Pt still leans down to put on shoes with pain in buttocks about 5/10- 8/10  very difficult pt reporting     Time  6    Period  Weeks    Status  On-going            Plan - 02/15/17 1644    Clinical Impression Statement  Pt continues to complain of tenderness over right SI joint and stiffness over low back.  Pt was able to come to understanding of  a proper pelvic tilt and how it helps with stiffness.  Pt still with dull right SI pain. Pt unable to perform a full motion bridge, hip extensor weakness.  Pt refuses TPDN and concentration on exercises and strengthening is preferred and recommended by PT.  Pt may benefit form traction next visit but main problem is weakness .  Needs Recertification next visit to continue total of eval and 13 visits ( completed 7 )   Rehab Potential  Good    PT Frequency  2x / week    PT Duration  6 weeks    PT Treatment/Interventions  Therapeutic exercise;Neuromuscular re-education;Dry needling;Gait training;Taping;Manual techniques;Therapeutic activities;ADLs/Self Care Home Management;Electrical Stimulation;Moist Heat;Cryotherapy    PT Next Visit Plan  core, pt with flexion bias and stenosis and Right SI joint pain.  Educating pt on the previous differences. FOTO done 02-13-17, Needs RECERTIFICATION sent next visit    PT Home Exercise Plan  abdominal engagement, glut sets, SKTC; hip ER stretch, seated trunk rotation, hip  flexor stretch, bridging, trunk rotation, L stab standing SLR 4 way, clams with red t band  and sit to stand with and without goblet squat, self SI mobiliZation    Consulted and Agree with Plan of Care  Patient       Patient will benefit from skilled therapeutic intervention in order to improve the  following deficits and impairments:  Impaired sensation, Improper body mechanics, Pain, Postural dysfunction, Increased muscle spasms, Decreased activity tolerance, Decreased strength, Obesity, Difficulty walking  Visit Diagnosis: Chronic right-sided low back pain without sciatica  Pain in right hip  Difficulty in walking, not elsewhere classified  Muscle weakness (generalized)   G-Codes - 02/15/17 1703    Functional Assessment Tool Used (Outpatient Only)  FOTO    Functional Limitation  Mobility: Walking and moving around    Mobility: Walking and Moving Around Current Status [phone]([id number])  At least 60 percent but less than 80 percent impaired, limited or restricted 71%    Mobility: Walking and Moving Around Goal Status [phone])  At least 40 percent but less than 60 percent impaired, limited or restricted 52%       Problem List There are no active problems to display for this patient.  Garen LahLawrie Beardsley, PT Certified Exercise Expert for the Aging Adult  02/15/17 5:29 PM Phone: [phone] Fax: [phone]  [facility] [address] [place], [place], [address] Phone: [phone]   Fax:  [phone]  Name: Connie Cortez MRN: [id number] Date of Birth: [birth year]

## 2017-02-22 ENCOUNTER — Encounter: Payer: Self-pay | Admitting: Physical Therapy

## 2017-02-22 ENCOUNTER — Ambulatory Visit: Payer: Non-veteran care | Admitting: Physical Therapy

## 2017-02-22 DIAGNOSIS — M6281 Muscle weakness (generalized): Secondary | ICD-10-CM

## 2017-02-22 DIAGNOSIS — R262 Difficulty in walking, not elsewhere classified: Secondary | ICD-10-CM

## 2017-02-22 DIAGNOSIS — G8929 Other chronic pain: Secondary | ICD-10-CM

## 2017-02-22 DIAGNOSIS — M545 Low back pain, unspecified: Secondary | ICD-10-CM

## 2017-02-22 DIAGNOSIS — M25551 Pain in right hip: Secondary | ICD-10-CM

## 2017-02-22 NOTE — Therapy (Signed)
[facility] [address] [place], [place], [address] Phone: [phone]   Fax:  [phone]  Physical Therapy Treatment/ERO  Patient Details  Name: Connie PatienceJulia Cortez MRN: [id number] Date of Birth: 11/24/1960 Referring Provider: Daisy FloroAntoinette Wymer   Encounter Date: 02/22/2017  PT End of Session - 02/22/17 1550    Visit Number  8    Number of Visits  14    Date for PT Re-Evaluation  02/23/17    Authorization Type  [facility]- to see PT only    PT Start Time  1550    PT Stop Time  1631    PT Time Calculation (min)  41 min    Activity Tolerance  Patient tolerated treatment well    Behavior During Therapy  [facility] for tasks assessed/performed       Past Medical History:  Diagnosis Date  . Bronchitis     Past Surgical History:  Procedure Laterality Date  . ABDOMINAL HYSTERECTOMY    . KNEE SURGERY      There were no vitals filed for this visit.  Subjective Assessment - 02/22/17 1552    Subjective  I am okay. It just feels irritated, different than painful. Less frequent into legs. I would like to do more dry needling.     How long can you sit comfortably?  30 min before I start wiggling    How long can you stand comfortably?  30 min    Currently in Pain?  Yes    Pain Score  6     Pain Location  Back    Pain Orientation  Right;Left Rt is worse    Pain Descriptors / Indicators  Aching;Constant    Aggravating Factors   being on feet/walking, repetitive motions, reaching overhead    Pain Relieving Factors  rest, bracing abdomen         [facility] PT Assessment - 02/22/17 0001      Assessment   Medical Diagnosis  back pain and spinal stenosis    Referring Provider  Antoinette Wymer      Observation/Other Assessments   Focus on Therapeutic Outcomes (FOTO)   67% limited      Sensation   Additional Comments  only occasional tingling in leg                  [facility] Adult PT Treatment/Exercise - 02/22/17 0001      Lumbar Exercises: Aerobic   Elliptical  training+4 min      Lumbar Exercises: Standing   Other Standing Lumbar Exercises  gait training: trunk rotation, core engagement, heel toe      Lumbar Exercises: Seated   Other Seated Lumbar Exercises  seated pelvic tilt             PT Education - 02/22/17 1627    Education provided  Yes    Education Details  shoes          PT Long Term Goals - 02/22/17 1601      PT LONG TERM GOAL #1   Title  FOTO to 52% limitation to indicate significant improvement in functional ability    Baseline  67% limited    Time  5    Period  Weeks    Status  On-going    Target Date  04/03/17      PT LONG TERM GOAL #2   Title  Pt will verbalize ability to utilize core/gluts for postural alignment to improve standing endurance    Baseline  has better knowledge about what she should do and what it should look like    Time  5    Period  Weeks    Status  Achieved    Target Date  04/03/17      PT LONG TERM GOAL #3   Title  Pt will be able to clear leg to lift into car after work day/being up and about    Baseline  it is much improved    Time  5    Period  Weeks    Status  On-going    Target Date  04/03/17      PT LONG TERM GOAL #4   Title  Pt will be able to don shoes without increased LBP    Baseline  pain to about 5/10 to don shoes    Time  5    Period  Weeks    Status  On-going    Target Date  04/03/17      PT LONG TERM GOAL #5   Title  pt will be able to use eliptical and walk for exercise with proper form    Baseline  will continue to educate    Time  5    Period  Weeks    Status  New    Target Date  04/03/17            Plan - 02/22/17 1737    Clinical Impression Statement  Pt continues to make progress toward goals and decreasing overall pain levels but would benefit from further treatment to continue challenging functional endurance and gait pattern for long term care. She is more aware of using her abdominal wall in  activities. Discussed the importance of short, 2-3 minute breaks every 2 hours or so to avoid reaching levels of pain that are causing compensation. Discussed shoe wear and encouraged her to get a more appropraite pair than what she was wearing- shoes were too narrow for inserts resulting in inversion/eversion instability. Pt would like to continue TPDN to reduce tension in low back/hip. Practiced utilization of eliptical and proper gait pattern so pt is able to increase exercise.     PT Treatment/Interventions  Therapeutic exercise;Neuromuscular re-education;Dry needling;Gait training;Taping;Manual techniques;Therapeutic activities;ADLs/Self Care Home Management;Electrical Stimulation;Moist Heat;Cryotherapy    PT Next Visit Plan  core strength/endurance, gait pattern, stenosis-flexion bias, hip extensor strengthening for upright posture    PT Home Exercise Plan  abdominal engagement, glut sets, SKTC; hip ER stretch, seated trunk rotation, hip flexor stretch, bridging, trunk rotation, L stab standing SLR 4 way, clams with red t band  and sit to stand with and without goblet squat, self SI mobiliZation    Consulted and Agree with Plan of Care  Patient       Patient will benefit from skilled therapeutic intervention in order to improve the following deficits and impairments:  Impaired sensation, Improper body mechanics, Pain, Postural dysfunction, Increased muscle spasms, Decreased activity tolerance, Decreased strength, Obesity, Difficulty walking  Visit Diagnosis: Chronic right-sided low back pain without sciatica - Plan: PT plan of care cert/re-cert  Pain in right hip - Plan: PT plan of care cert/re-cert  Difficulty in walking, not elsewhere classified - Plan: PT plan of care cert/re-cert  Muscle weakness (generalized) - Plan: PT plan of care cert/re-cert   G-Codes - 02/22/17 1743    Functional Assessment Tool Used (Outpatient Only)  FOTO 67% limited, clinical judgement    Mobility: Walking  and Moving Around Current Status ([id number])  At least  60 percent but less than 80 percent impaired, limited or restricted    Mobility: Walking and Moving Around Goal Status [phone])  At least 40 percent but less than 60 percent impaired, limited or restricted       Problem List There are no active problems to display for this patient.   Welden Hausmann C. Slater Mcmanaman PT, DPT 02/22/17 5:48 PM   [facility] [address] [place], [place], [address] Phone: [phone]   Fax:  [phone]  Name: Connie Cortez MRN: [id number] Date of Birth: [birth year]

## 2017-03-05 ENCOUNTER — Encounter: Payer: Self-pay | Admitting: Physical Therapy

## 2017-03-05 ENCOUNTER — Ambulatory Visit: Payer: Non-veteran care | Attending: Internal Medicine | Admitting: Physical Therapy

## 2017-03-05 DIAGNOSIS — R262 Difficulty in walking, not elsewhere classified: Secondary | ICD-10-CM | POA: Diagnosis present

## 2017-03-05 DIAGNOSIS — G8929 Other chronic pain: Secondary | ICD-10-CM | POA: Diagnosis present

## 2017-03-05 DIAGNOSIS — M25551 Pain in right hip: Secondary | ICD-10-CM | POA: Diagnosis present

## 2017-03-05 DIAGNOSIS — M545 Low back pain, unspecified: Secondary | ICD-10-CM

## 2017-03-05 DIAGNOSIS — M6281 Muscle weakness (generalized): Secondary | ICD-10-CM | POA: Insufficient documentation

## 2017-03-05 NOTE — Therapy (Signed)
[facility] [address] [place], [place], [address] Phone: [phone]   Fax:  [phone]  Physical Therapy Treatment  Patient Details  Name: Connie Cortez MRN: [id number] Date of Birth: 1960-10-07 Referring Provider: Daisy FloroAntoinette Wymer   Encounter Date: 03/05/2017  PT End of Session - 03/05/17 0930    Visit Number  9    Number of Visits  14    Date for PT Re-Evaluation  02/23/17    Authorization Type  [facility]- to see PT only    PT Start Time  0930    PT Stop Time  1013    PT Time Calculation (min)  43 min    Activity Tolerance  Patient tolerated treatment well    Behavior During Therapy  [facility] for tasks assessed/performed       Past Medical History:  Diagnosis Date  . Bronchitis     Past Surgical History:  Procedure Laterality Date  . ABDOMINAL HYSTERECTOMY    . KNEE SURGERY      There were no vitals filed for this visit.  Subjective Assessment - 03/05/17 0930    Subjective  Constant, steady ache that is going across low back. (Pt waves her arms side to side as she tries to describe)   I am more aware of my posture/muscle engagement and breathing. The numbness is not as steady as it was, I am doing okay taking shorter, frequent breaks. Did not get new shoes.     Currently in Pain?  Yes    Pain Score  8     Pain Location  Back    Pain Descriptors / Indicators  Aching    Aggravating Factors   becoming fatigued    Pain Relieving Factors  rest, bracing                      [facility] Adult PT Treatment/Exercise - 03/05/17 0001      Lumbar Exercises: Aerobic   Stationary Bike  nu step L7 6 min UE & LE      Lumbar Exercises: Standing   Other Standing Lumbar Exercises  foward step & reach, single arm & bilat      Lumbar Exercises: Seated   Other Seated Lumbar Exercises  forward reach with pelvic tilt 2lb weights    Other Seated Lumbar Exercises  seated marching in chair with UE resist      Lumbar  Exercises: Supine   Other Supine Lumbar Exercises  supine ball squeeze with UE horiz abd; with UE flx (band & alternating)             PT Education - 03/05/17 1012    Education provided  Yes    Education Details  mattresses, awareness, turning functional activities into exercises.     Person(s) Educated  Patient    Methods  Demonstration;Tactile cues;Verbal cues;Handout;Explanation    Comprehension  Verbalized understanding;Need further instruction;Returned demonstration;Verbal cues required;Tactile cues required          PT Long Term Goals - 02/22/17 1601      PT LONG TERM GOAL #1   Title  FOTO to 52% limitation to indicate significant improvement in functional ability    Baseline  67% limited    Time  5    Period  Weeks    Status  On-going    Target Date  04/03/17      PT LONG TERM GOAL #2   Title  Pt will verbalize ability to utilize  core/gluts for postural alignment to improve standing endurance    Baseline  has better knowledge about what she should do and what it should look like    Time  5    Period  Weeks    Status  Achieved    Target Date  04/03/17      PT LONG TERM GOAL #3   Title  Pt will be able to clear leg to lift into car after work day/being up and about    Baseline  it is much improved    Time  5    Period  Weeks    Status  On-going    Target Date  04/03/17      PT LONG TERM GOAL #4   Title  Pt will be able to don shoes without increased LBP    Baseline  pain to about 5/10 to don shoes    Time  5    Period  Weeks    Status  On-going    Target Date  04/03/17      PT LONG TERM GOAL #5   Title  pt will be able to use eliptical and walk for exercise with proper form    Baseline  will continue to educate    Time  5    Period  Weeks    Status  New    Target Date  04/03/17            Plan - 03/05/17 1259    Clinical Impression Statement  Focused on stabilization exercises and turned functional activities into exercises to challenge  abdominal stability awareness. Pt did not have any increase pain with exercises but did require rest break due to fatigue in thoracic spine. Responds well to stabilization exercises so chose to forego DN today.     PT Treatment/Interventions  Therapeutic exercise;Neuromuscular re-education;Dry needling;Gait training;Taping;Manual techniques;Therapeutic activities;ADLs/Self Care Home Management;Electrical Stimulation;Moist Heat;Cryotherapy    PT Next Visit Plan  core strength/endurance, gait pattern, stenosis-flexion bias, hip extensor strengthening for upright posture    PT Home Exercise Plan  abdominal engagement, glut sets, SKTC; hip ER stretch, seated trunk rotation, hip flexor stretch, bridging, trunk rotation, L stab standing SLR 4 way, clams with red t band  and sit to stand with and without goblet squat, self SI mobiliZation    Consulted and Agree with Plan of Care  Patient       Patient will benefit from skilled therapeutic intervention in order to improve the following deficits and impairments:  Impaired sensation, Improper body mechanics, Pain, Postural dysfunction, Increased muscle spasms, Decreased activity tolerance, Decreased strength, Obesity, Difficulty walking  Visit Diagnosis: Chronic right-sided low back pain without sciatica  Pain in right hip  Difficulty in walking, not elsewhere classified  Muscle weakness (generalized)     Problem List There are no active problems to display for this patient.  Jedrek Dinovo C. Ulah Olmo PT, DPT 03/05/17 1:03 PM   [facility] [address] [place], [place], [address] Phone: [phone]   Fax:  [phone]  Name: Connie Cortez MRN: [id number] Date of Birth: 11-05-1960

## 2017-03-07 ENCOUNTER — Encounter: Payer: Self-pay | Admitting: Physical Therapy

## 2017-03-07 ENCOUNTER — Ambulatory Visit: Payer: Non-veteran care | Admitting: Physical Therapy

## 2017-03-07 DIAGNOSIS — M545 Low back pain: Secondary | ICD-10-CM | POA: Diagnosis not present

## 2017-03-07 DIAGNOSIS — R262 Difficulty in walking, not elsewhere classified: Secondary | ICD-10-CM

## 2017-03-07 DIAGNOSIS — M25551 Pain in right hip: Secondary | ICD-10-CM

## 2017-03-07 DIAGNOSIS — G8929 Other chronic pain: Secondary | ICD-10-CM

## 2017-03-07 DIAGNOSIS — M6281 Muscle weakness (generalized): Secondary | ICD-10-CM

## 2017-03-07 NOTE — Therapy (Signed)
[facility] Outpatient Rehabilitation [facility] [address] [place], [place], [address] Phone: [phone]   Fax:  [phone]  Physical Therapy Treatment  Patient Details  Name: Temeka Pore MRN: [id number] Date of Birth: 01-05-1961 Referring Provider: Daisy Floro   Encounter Date: 03/07/2017  PT End of Session - 03/07/17 1643    Visit Number  10    Number of Visits  14    Date for PT Re-Evaluation  04/03/17    Authorization Type  VA- to see PT only    PT Start Time  1640 pt arrived late    PT Stop Time  1718    PT Time Calculation (min)  38 min    Activity Tolerance  Patient tolerated treatment well    Behavior During Therapy  [facility] for tasks assessed/performed       Past Medical History:  Diagnosis Date  . Bronchitis     Past Surgical History:  Procedure Laterality Date  . ABDOMINAL HYSTERECTOMY    . KNEE SURGERY      There were no vitals filed for this visit.  Subjective Assessment - 03/07/17 1644    Subjective  Stabilization exercises helped me to focus on what I need to go as I go through my day. Not any extra pain today, just the same constant, steady, ache.     Currently in Pain?  Yes    Pain Score  5     Pain Location  Back    Pain Orientation  Right    Pain Descriptors / Indicators  Aching;Constant aggrivated    Aggravating Factors   allowing body to become fatigued.     Pain Relieving Factors  rest, abdominal engagement                      [facility] Adult PT Treatment/Exercise - 03/07/17 0001      Lumbar Exercises: Stretches   Passive Hamstring Stretch  2 reps;30 seconds gastroc stretch 2x30s slant board    Prone Mid Back Stretch  Other (comment) standing QL stretch      Lumbar Exercises: Aerobic   Stationary Bike  8 min L6      Lumbar Exercises: Seated   Sit to Stand  10 reps with breathing & glut sets      Lumbar Exercises: Supine   Bridge  15 reps with ball squeeze      Lumbar Exercises: Sidelying   Clam  20 reps;10 reps both             PT Education - 03/07/17 1717    Education provided  Yes    Education Details  breathing and movement, exercise form/rationale, HEP    Person(s) Educated  Patient    Methods  Explanation;Demonstration;Tactile cues;Verbal cues;Handout    Comprehension  Verbalized understanding;Need further instruction;Returned demonstration;Verbal cues required;Tactile cues required          PT Long Term Goals - 02/22/17 1601      PT LONG TERM GOAL #1   Title  FOTO to 52% limitation to indicate significant improvement in functional ability    Baseline  67% limited    Time  5    Period  Weeks    Status  On-going    Target Date  04/03/17      PT LONG TERM GOAL #2   Title  Pt will verbalize ability to utilize core/gluts for postural alignment to improve standing endurance    Baseline  has better knowledge about  what she should do and what it should look like    Time  5    Period  Weeks    Status  Achieved    Target Date  04/03/17      PT LONG TERM GOAL #3   Title  Pt will be able to clear leg to lift into car after work day/being up and about    Baseline  it is much improved    Time  5    Period  Weeks    Status  On-going    Target Date  04/03/17      PT LONG TERM GOAL #4   Title  Pt will be able to don shoes without increased LBP    Baseline  pain to about 5/10 to don shoes    Time  5    Period  Weeks    Status  On-going    Target Date  04/03/17      PT LONG TERM GOAL #5   Title  pt will be able to use eliptical and walk for exercise with proper form    Baseline  will continue to educate    Time  5    Period  Weeks    Status  New    Target Date  04/03/17            Plan - 03/07/17 1700    Clinical Impression Statement  Reports numbness in Rt arm was resolved with QL stretch. Practiced utilization of breathing with movement to decrease pressure which pt verbalized reduction in pain.     PT Treatment/Interventions  Therapeutic  exercise;Neuromuscular re-education;Dry needling;Gait training;Taping;Manual techniques;Therapeutic activities;ADLs/Self Care Home Management;Electrical Stimulation;Moist Heat;Cryotherapy    PT Next Visit Plan  did she practice breathing with movement, CKC postural training upright, OH reaching with reduction in lumbar extension    PT Home Exercise Plan  abdominal engagement, glut sets, SKTC; hip ER stretch, seated trunk rotation, hip flexor stretch, bridging, trunk rotation, L stab standing SLR 4 way, clams with red t band  and sit to stand with and without goblet squat, self SI mobiliZation    Consulted and Agree with Plan of Care  Patient       Patient will benefit from skilled therapeutic intervention in order to improve the following deficits and impairments:  Impaired sensation, Improper body mechanics, Pain, Postural dysfunction, Increased muscle spasms, Decreased activity tolerance, Decreased strength, Obesity, Difficulty walking  Visit Diagnosis: Chronic right-sided low back pain without sciatica  Pain in right hip  Difficulty in walking, not elsewhere classified  Muscle weakness (generalized)     Problem List There are no active problems to display for this patient.   Iyani Dresner C. Jacoby Zanni PT, DPT 03/07/17 5:24 PM   [facility] [address] [place], [place], [address] Phone: [phone]   Fax:  [phone]  Name: [name] MRN: [id number] Date of Birth: 1961-01-01

## 2017-03-13 ENCOUNTER — Ambulatory Visit: Payer: Non-veteran care | Admitting: Physical Therapy

## 2017-03-13 ENCOUNTER — Encounter: Payer: Self-pay | Admitting: Physical Therapy

## 2017-03-13 DIAGNOSIS — M545 Low back pain: Principal | ICD-10-CM

## 2017-03-13 DIAGNOSIS — R262 Difficulty in walking, not elsewhere classified: Secondary | ICD-10-CM

## 2017-03-13 DIAGNOSIS — M6281 Muscle weakness (generalized): Secondary | ICD-10-CM

## 2017-03-13 DIAGNOSIS — M25551 Pain in right hip: Secondary | ICD-10-CM

## 2017-03-13 DIAGNOSIS — G8929 Other chronic pain: Secondary | ICD-10-CM

## 2017-03-13 NOTE — Therapy (Signed)
[facility] [address] [place], [place], [address] Phone: [phone]   Fax:  [phone]  Physical Therapy Treatment  Patient Details  Name: Connie Cortez MRN: [id number] Date of Birth: [birth year] Referring Provider: Daisy FloroAntoinette Wymer   Encounter Date: 03/13/2017  PT End of Session - 03/13/17 1546    Visit Number  11    Number of Visits  14    Date for PT Re-Evaluation  04/03/17    Authorization Type  VA- to see PT only    PT Start Time  1545    PT Stop Time  1638    PT Time Calculation (min)  53 min    Activity Tolerance  Patient tolerated treatment well    Behavior During Therapy  [facility] for tasks assessed/performed       Past Medical History:  Diagnosis Date  . Bronchitis     Past Surgical History:  Procedure Laterality Date  . ABDOMINAL HYSTERECTOMY    . KNEE SURGERY      There were no vitals filed for this visit.  Subjective Assessment - 03/13/17 1546    Subjective  Today I am not too bad. Yesterday I had a really bad day but was able to calm things down with ice. Would like to do DN today. Feels pain most pronounced along Rt side of back that travels up.     Currently in Pain?  Yes    Pain Score  4     Pain Location  Back    Pain Orientation  Right;Left R worse than L    Pain Descriptors / Indicators  Dull;Aching    Aggravating Factors   getting to fatigue    Pain Relieving Factors  ice, exercises/stretches                      [facility] Adult PT Treatment/Exercise - 03/13/17 0001      Lumbar Exercises: Stretches   Prone Mid Back Stretch  Other (comment) QL stretch in door      Lumbar Exercises: Aerobic   Stationary Bike  6 min L5 nu step UE & LE      Lumbar Exercises: Standing   Other Standing Lumbar Exercises  with physioball: press into counter, roll up wall bilat, roll up wall alt UE/LE, adduction +flexion ball OH      Lumbar Exercises: Seated   Other Seated Lumbar Exercises   quick review of HEP from last visit      Cryotherapy   Number Minutes Cryotherapy  10 Minutes    Cryotherapy Location  Lumbar Spine    Type of Cryotherapy  Ice pack      Manual Therapy   Manual therapy comments  skilled palpation & monitoring during TPDN    Soft tissue mobilization  Rt QL       Trigger Point Dry Needling - 03/13/17 1608    Muscles Treated Lower Body  -- quadratus lumborum Rt                PT Long Term Goals - 02/22/17 1601      PT LONG TERM GOAL #1   Title  FOTO to 52% limitation to indicate significant improvement in functional ability    Baseline  67% limited    Time  5    Period  Weeks    Status  On-going    Target Date  04/03/17      PT LONG TERM GOAL #2  Title  Pt will verbalize ability to utilize core/gluts for postural alignment to improve standing endurance    Baseline  has better knowledge about what she should do and what it should look like    Time  5    Period  Weeks    Status  Achieved    Target Date  04/03/17      PT LONG TERM GOAL #3   Title  Pt will be able to clear leg to lift into car after work day/being up and about    Baseline  it is much improved    Time  5    Period  Weeks    Status  On-going    Target Date  04/03/17      PT LONG TERM GOAL #4   Title  Pt will be able to don shoes without increased LBP    Baseline  pain to about 5/10 to don shoes    Time  5    Period  Weeks    Status  On-going    Target Date  04/03/17      PT LONG TERM GOAL #5   Title  pt will be able to use eliptical and walk for exercise with proper form    Baseline  will continue to educate    Time  5    Period  Weeks    Status  New    Target Date  04/03/17            Plan - 03/13/17 1634    Clinical Impression Statement  Pt requested visit frequency of 1/week to finish last 3 auth visits. Discussed expecting "bad days" and understanding how to stretch/exercise to manage.     PT Treatment/Interventions  Therapeutic  exercise;Neuromuscular re-education;Dry needling;Gait training;Taping;Manual techniques;Therapeutic activities;ADLs/Self Care Home Management;Electrical Stimulation;Moist Heat;Cryotherapy    PT Next Visit Plan  continue functional reaching/lifting, manual treatment PRN    PT Home Exercise Plan  abdominal engagement, glut sets, SKTC; hip ER stretch, seated trunk rotation, hip flexor stretch, bridging, trunk rotation, L stab standing SLR 4 way, clams with red t band  and sit to stand with and without goblet squat, self SI mobiliZation    Consulted and Agree with Plan of Care  Patient       Patient will benefit from skilled therapeutic intervention in order to improve the following deficits and impairments:  Impaired sensation, Improper body mechanics, Pain, Postural dysfunction, Increased muscle spasms, Decreased activity tolerance, Decreased strength, Obesity, Difficulty walking  Visit Diagnosis: Chronic right-sided low back pain without sciatica  Pain in right hip  Difficulty in walking, not elsewhere classified  Muscle weakness (generalized)     Problem List There are no active problems to display for this patient.   Sabastien Tyler C. Kyeshia Zinn PT, DPT 03/13/17 4:51 PM   [facility] Out[facility] [address] [place], [place], [address] Phone: [phone]   Fax:  [phone]  Name: Connie Cortez MRN: [id number] Date of Birth: 09/22/1960

## 2017-03-15 ENCOUNTER — Ambulatory Visit: Payer: Non-veteran care | Admitting: Physical Therapy

## 2017-03-15 ENCOUNTER — Encounter: Payer: Self-pay | Admitting: Physical Therapy

## 2017-03-15 DIAGNOSIS — M25551 Pain in right hip: Secondary | ICD-10-CM

## 2017-03-15 DIAGNOSIS — G8929 Other chronic pain: Secondary | ICD-10-CM

## 2017-03-15 DIAGNOSIS — R262 Difficulty in walking, not elsewhere classified: Secondary | ICD-10-CM

## 2017-03-15 DIAGNOSIS — M6281 Muscle weakness (generalized): Secondary | ICD-10-CM

## 2017-03-15 DIAGNOSIS — M545 Low back pain: Secondary | ICD-10-CM | POA: Diagnosis not present

## 2017-03-15 NOTE — Therapy (Signed)
[facility] Outpatient Rehabilitation [facility] [address] [place], [place], [address] Phone: [phone]   Fax:  [phone]  Physical Therapy Treatment  Patient Details  Name: Connie Cortez MRN: [id number] Date of Birth: 05/08/1960 Referring Provider: Daisy Floro   Encounter Date: 03/15/2017  PT End of Session - 03/15/17 1635    Visit Number  12    Number of Visits  14    Date for PT Re-Evaluation  04/03/17    Authorization Type  VA- to see PT only    PT Start Time  1631    PT Stop Time  1712    PT Time Calculation (min)  41 min    Activity Tolerance  Patient tolerated treatment well    Behavior During Therapy  [facility] for tasks assessed/performed       Past Medical History:  Diagnosis Date  . Bronchitis     Past Surgical History:  Procedure Laterality Date  . ABDOMINAL HYSTERECTOMY    . KNEE SURGERY      There were no vitals filed for this visit.  Subjective Assessment - 03/15/17 1635    Subjective  I am okay today. Ice seems to be what calms me down. Still tender on Rt side. I am afraid I am going to fall-Rt leg. It's like my Rt foot is dragging. Feels a sleepiness in toes 2 & 3.                       [facility] Adult PT Treatment/Exercise - 03/15/17 0001      Lumbar Exercises: Stretches   Passive Hamstring Stretch  2 reps;30 seconds      Lumbar Exercises: Aerobic   Stationary Bike  L7 UE & LE 6 min      Lumbar Exercises: Standing   Other Standing Lumbar Exercises  wobble board A/P & lat static & dynamic    Other Standing Lumbar Exercises  gait training: hurdles, center weight, heel-toe, step over obstacles, using peripheral vistion                  PT Long Term Goals - 02/22/17 1601      PT LONG TERM GOAL #1   Title  FOTO to 52% limitation to indicate significant improvement in functional ability    Baseline  67% limited    Time  5    Period  Weeks    Status  On-going    Target Date  04/03/17      PT LONG TERM GOAL #2   Title  Pt will verbalize ability to utilize core/gluts for postural alignment to improve standing endurance    Baseline  has better knowledge about what she should do and what it should look like    Time  5    Period  Weeks    Status  Achieved    Target Date  04/03/17      PT LONG TERM GOAL #3   Title  Pt will be able to clear leg to lift into car after work day/being up and about    Baseline  it is much improved    Time  5    Period  Weeks    Status  On-going    Target Date  04/03/17      PT LONG TERM GOAL #4   Title  Pt will be able to don shoes without increased LBP    Baseline  pain to about 5/10 to don shoes  Time  5    Period  Weeks    Status  On-going    Target Date  04/03/17      PT LONG TERM GOAL #5   Title  pt will be able to use eliptical and walk for exercise with proper form    Baseline  will continue to educate    Time  5    Period  Weeks    Status  New    Target Date  04/03/17            Plan - 03/15/17 1720    Clinical Impression Statement  Addressed discomfort in gait and challenged her pattern which pt tolerated well. Additional cues for abdominal contraction and breathing. Was able to demo gait with centralized weight to reduce lumbar sidebend.     PT Treatment/Interventions  Therapeutic exercise;Neuromuscular re-education;Dry needling;Gait training;Taping;Manual techniques;Therapeutic activities;ADLs/Self Care Home Management;Electrical Stimulation;Moist Heat;Cryotherapy    PT Next Visit Plan  how did gait challenges go?    PT Home Exercise Plan  abdominal engagement, glut sets, SKTC; hip ER stretch, seated trunk rotation, hip flexor stretch, bridging, trunk rotation, L stab standing SLR 4 way, clams with red t band  and sit to stand with and without goblet squat, self SI mobiliZation    Consulted and Agree with Plan of Care  Patient       Patient will benefit from skilled therapeutic intervention in order to improve the  following deficits and impairments:  Impaired sensation, Improper body mechanics, Pain, Postural dysfunction, Increased muscle spasms, Decreased activity tolerance, Decreased strength, Obesity, Difficulty walking  Visit Diagnosis: Chronic right-sided low back pain without sciatica  Pain in right hip  Difficulty in walking, not elsewhere classified  Muscle weakness (generalized)     Problem List There are no active problems to display for this patient.   Kimmerly Lora C. Nevan Creighton PT, DPT 03/15/17 5:24 PM   [facility] [address] [place], [place], [address] Phone: [phone]   Fax:  [phone]  Name: Connie PatienceJulia Cortez MRN: [id number] Date of Birth: 04/21/1960

## 2017-03-20 ENCOUNTER — Encounter: Payer: No Typology Code available for payment source | Admitting: Physical Therapy

## 2017-03-22 ENCOUNTER — Encounter: Payer: Self-pay | Admitting: Physical Therapy

## 2017-03-22 ENCOUNTER — Ambulatory Visit: Payer: Non-veteran care | Admitting: Physical Therapy

## 2017-03-22 DIAGNOSIS — M545 Low back pain, unspecified: Secondary | ICD-10-CM

## 2017-03-22 DIAGNOSIS — M25551 Pain in right hip: Secondary | ICD-10-CM

## 2017-03-22 DIAGNOSIS — G8929 Other chronic pain: Secondary | ICD-10-CM

## 2017-03-22 DIAGNOSIS — R262 Difficulty in walking, not elsewhere classified: Secondary | ICD-10-CM

## 2017-03-22 DIAGNOSIS — M6281 Muscle weakness (generalized): Secondary | ICD-10-CM

## 2017-03-22 NOTE — Therapy (Signed)
[facility] Outpatient Rehabilitation [facility] [address] [place], [place], [address] Phone: [phone]   Fax:  [phone]  Physical Therapy Treatment  Patient Details  Name: Connie Cortez MRN: [id number] Date of Birth: August 28, 1960 Referring Provider: Daisy Floro   Encounter Date: 03/22/2017  PT End of Session - 03/22/17 1604    Visit Number  13    Number of Visits  14    Date for PT Re-Evaluation  04/03/17    Authorization Type  VA- to see PT only    PT Start Time  1604 pt arrived late    PT Stop Time  1630    PT Time Calculation (min)  26 min       Past Medical History:  Diagnosis Date  . Bronchitis     Past Surgical History:  Procedure Laterality Date  . ABDOMINAL HYSTERECTOMY    . KNEE SURGERY      There were no vitals filed for this visit.  Subjective Assessment - 03/22/17 1606    Subjective  Had a pretty rough day on Tuesday- a new symptom. Lt leg begun tingling.     Currently in Pain?  Yes    Pain Score  6     Pain Location  Back    Pain Orientation  Left                      [facility] Adult PT Treatment/Exercise - 03/22/17 0001      Lumbar Exercises: Stretches   Active Hamstring Stretch  Other (comment) sciatic nerve glide      Lumbar Exercises: Aerobic   Stationary Bike  5 min L7 UE & LE      Lumbar Exercises: Standing   Other Standing Lumbar Exercises  parallel bars: tandem, stop/go, stepping EO/EC                  PT Long Term Goals - 02/22/17 1601      PT LONG TERM GOAL #1   Title  FOTO to 52% limitation to indicate significant improvement in functional ability    Baseline  67% limited    Time  5    Period  Weeks    Status  On-going    Target Date  04/03/17      PT LONG TERM GOAL #2   Title  Pt will verbalize ability to utilize core/gluts for postural alignment to improve standing endurance    Baseline  has better knowledge about what she should do and what it should look like    Time  5    Period  Weeks    Status  Achieved    Target Date  04/03/17      PT LONG TERM GOAL #3   Title  Pt will be able to clear leg to lift into car after work day/being up and about    Baseline  it is much improved    Time  5    Period  Weeks    Status  On-going    Target Date  04/03/17      PT LONG TERM GOAL #4   Title  Pt will be able to don shoes without increased LBP    Baseline  pain to about 5/10 to don shoes    Time  5    Period  Weeks    Status  On-going    Target Date  04/03/17      PT LONG TERM GOAL #5  Title  pt will be able to use eliptical and walk for exercise with proper form    Baseline  will continue to educate    Time  5    Period  Weeks    Status  New    Target Date  04/03/17            Plan - 03/22/17 2036    Clinical Impression Statement  S/S consistent with sciatic nerve irritation down post Lt leg today was reduced with nerve glide. continues to flex slightly in gait resulting in posterior chain tightness. Pt is aware of this and understands the need to correct.     PT Treatment/Interventions  Therapeutic exercise;Neuromuscular re-education;Dry needling;Gait training;Taping;Manual techniques;Therapeutic activities;ADLs/Self Care Home Management;Electrical Stimulation;Moist Heat;Cryotherapy    PT Next Visit Plan  review for finalized HEP, D/C    PT Home Exercise Plan  abdominal engagement, glut sets, SKTC; hip ER stretch, seated trunk rotation, hip flexor stretch, bridging, trunk rotation, L stab standing SLR 4 way, clams with red t band  and sit to stand with and without goblet squat, self SI mobiliZation    Consulted and Agree with Plan of Care  Patient       Patient will benefit from skilled therapeutic intervention in order to improve the following deficits and impairments:  Impaired sensation, Improper body mechanics, Pain, Postural dysfunction, Increased muscle spasms, Decreased activity tolerance, Decreased strength, Obesity, Difficulty  walking  Visit Diagnosis: Chronic right-sided low back pain without sciatica  Pain in right hip  Difficulty in walking, not elsewhere classified  Muscle weakness (generalized)     Problem List There are no active problems to display for this patient.   [name] PT, DPT 03/22/17 8:39 PM   [facility] [address] [place], [place], [address] Phone: [phone]   Fax:  [phone]  Name: Connie PatienceJulia Cortez MRN: [id number] Date of Birth: 10/29/1960

## 2017-03-27 ENCOUNTER — Ambulatory Visit: Payer: Non-veteran care | Admitting: Physical Therapy

## 2017-03-27 ENCOUNTER — Encounter: Payer: Self-pay | Admitting: Physical Therapy

## 2017-03-27 DIAGNOSIS — M25551 Pain in right hip: Secondary | ICD-10-CM

## 2017-03-27 DIAGNOSIS — M6281 Muscle weakness (generalized): Secondary | ICD-10-CM

## 2017-03-27 DIAGNOSIS — R262 Difficulty in walking, not elsewhere classified: Secondary | ICD-10-CM

## 2017-03-27 DIAGNOSIS — G8929 Other chronic pain: Secondary | ICD-10-CM

## 2017-03-27 DIAGNOSIS — M545 Low back pain: Principal | ICD-10-CM

## 2017-03-27 NOTE — Therapy (Signed)
[place], [place], [address] Phone: [phone]   Fax:  [phone]  Physical Therapy Treatment/Discharge Summary  Patient Details  Name: Connie Cortez MRN: [id number] Date of Birth: [birth year] Referring Provider: Rodney Langton   Encounter Date: 03/27/2017  PT End of Session - 03/27/17 1544    Visit Number  14    Number of Visits  14    Date for PT Re-Evaluation  04/03/17    Authorization Type  [facility]- to see PT only    PT Start Time  1545    PT Stop Time  1628    PT Time Calculation (min)  43 min    Activity Tolerance  Patient tolerated treatment well    Behavior During Therapy  [facility] for tasks assessed/performed       Past Medical History:  Diagnosis Date  . Bronchitis     Past Surgical History:  Procedure Laterality Date  . ABDOMINAL HYSTERECTOMY    . KNEE SURGERY      There were no vitals filed for this visit.  Subjective Assessment - 03/27/17 1545    Subjective  I don't feel bad at all. Still have numbness at bottom of foot. Able to use heat to calm back when needed. Intermittent tingling down lateral Lt leg. Nerve flossing has been helpful.     Currently in Pain?  Yes    Pain Score  5     Pain Location  Back    Pain Orientation  Right;Left both but more Rt    Pain Descriptors / Indicators  Aching    Pain Relieving Factors  ergonomics, exercises/stretches, ice/heat         [facility] PT Assessment - 03/27/17 0001      Observation/Other Assessments   Focus on Therapeutic Outcomes (FOTO)   67% limited      Sensation   Additional Comments  occasional tingling lateral Lt thigh, occasional numbness Rt foot                  [facility] Adult PT Treatment/Exercise - 03/27/17 0001      Therapeutic Activites    Therapeutic Activities  ADL's    ADL's  get into/out of car      Lumbar Exercises: Standing   Other Standing Lumbar Exercises  review of gait training      Lumbar  Exercises: Seated   Other Seated Lumbar Exercises  sciatic nerve flossing             PT Education - 03/27/17 1721    Education provided  Yes    Education Details  goals discussion, review of gait, importance of continued HEP    Person(s) Educated  Patient    Methods  Explanation    Comprehension  Verbalized understanding          PT Long Term Goals - 03/27/17 1601      PT LONG TERM GOAL #1   Title  FOTO to 52% limitation to indicate significant improvement in functional ability      PT LONG TERM GOAL #2   Title  Pt will verbalize ability to utilize core/gluts for postural alignment to improve standing endurance    Baseline  has better knowledge about what she should do and what it should look like    Status  Achieved      PT LONG TERM GOAL #3   Title  Pt will be able to clear leg to lift into car after  work day/being up and about    Baseline  able to do so when done appropriately    Status  Achieved      PT LONG TERM GOAL #4   Title  Pt will be able to don shoes without increased LBP    Baseline  does not increase pain    Status  Achieved      PT LONG TERM GOAL #5   Title  pt will be able to use eliptical and walk for exercise with proper form    Baseline  able    Status  Achieved            Plan - 03/27/17 1722    Clinical Impression Statement  Pt has met her goals at this time and has made significant improvement since beginning PT. She is able to demo upright posture and equal R/L activation in gait. Pt is aware of proper posture and was educated on the importance of continued use of posture in ADLs. Pt was instructed to contact us with any further questions.     PT Treatment/Interventions  Therapeutic exercise;Neuromuscular re-education;Dry needling;Gait training;Taping;Manual techniques;Therapeutic activities;ADLs/Self Care Home Management;Electrical Stimulation;Moist Heat;Cryotherapy    PT Home Exercise Plan  abdominal engagement, glut sets, SKTC; hip  ER stretch, seated trunk rotation, hip flexor stretch, bridging, trunk rotation, L stab standing SLR 4 way, clams with red t band  and sit to stand with and without goblet squat, self SI mobiliZation       Patient will benefit from skilled therapeutic intervention in order to improve the following deficits and impairments:  Impaired sensation, Improper body mechanics, Pain, Postural dysfunction, Increased muscle spasms, Decreased activity tolerance, Decreased strength, Obesity, Difficulty walking  Visit Diagnosis: Chronic right-sided low back pain without sciatica  Pain in right hip  Difficulty in walking, not elsewhere classified  Muscle weakness (generalized)     Problem List There are no active problems to display for this patient. PHYSICAL THERAPY DISCHARGE SUMMARY  Visits from Start of Care: 14  Current functional level related to goals / functional outcomes: See above   Remaining deficits: See above   Education / Equipment: Anatomy of condition, POC, HEP, exercise form/rationale  Plan: Patient agrees to discharge.  Patient goals were met. Patient is being discharged due to meeting the stated rehab goals.  ?????      C.  PT, DPT 03/27/17 5:25 PM   [facility] [address] [place], [place], [address] Phone: [phone]   Fax:  [phone]  Name: Connie Cortez MRN: [id number] Date of Birth: Sep 17, 1960

## 2017-03-29 ENCOUNTER — Encounter: Payer: No Typology Code available for payment source | Admitting: Physical Therapy

## 2017-04-03 ENCOUNTER — Encounter: Payer: No Typology Code available for payment source | Admitting: Physical Therapy

## 2018-01-03 ENCOUNTER — Encounter: Payer: Self-pay | Admitting: Physical Medicine & Rehabilitation

## 2018-01-22 ENCOUNTER — Ambulatory Visit (HOSPITAL_BASED_OUTPATIENT_CLINIC_OR_DEPARTMENT_OTHER): Payer: No Typology Code available for payment source | Admitting: Physical Medicine & Rehabilitation

## 2018-01-22 ENCOUNTER — Encounter: Payer: Self-pay | Admitting: Physical Medicine & Rehabilitation

## 2018-01-22 ENCOUNTER — Encounter: Payer: No Typology Code available for payment source | Attending: Physical Medicine & Rehabilitation

## 2018-01-22 VITALS — BP 174/99 | HR 74 | Ht 66.0 in | Wt 281.0 lb

## 2018-01-22 DIAGNOSIS — G894 Chronic pain syndrome: Secondary | ICD-10-CM | POA: Diagnosis not present

## 2018-01-22 DIAGNOSIS — M546 Pain in thoracic spine: Secondary | ICD-10-CM | POA: Diagnosis not present

## 2018-01-22 DIAGNOSIS — Z5181 Encounter for therapeutic drug level monitoring: Secondary | ICD-10-CM

## 2018-01-22 DIAGNOSIS — M48061 Spinal stenosis, lumbar region without neurogenic claudication: Secondary | ICD-10-CM

## 2018-01-22 DIAGNOSIS — G5701 Lesion of sciatic nerve, right lower limb: Secondary | ICD-10-CM

## 2018-01-22 DIAGNOSIS — M545 Low back pain: Secondary | ICD-10-CM | POA: Diagnosis not present

## 2018-01-22 DIAGNOSIS — M7918 Myalgia, other site: Secondary | ICD-10-CM | POA: Diagnosis not present

## 2018-01-22 DIAGNOSIS — M5416 Radiculopathy, lumbar region: Secondary | ICD-10-CM

## 2018-01-22 DIAGNOSIS — G8929 Other chronic pain: Secondary | ICD-10-CM | POA: Insufficient documentation

## 2018-01-22 NOTE — Progress Notes (Addendum)
Subjective:    Patient ID: Connie Cortez, female    DOB: 25-May-1960, 57 y.o.   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 She is wishing to decide between a right versus bilateral surgery for decompression given failure of prior conservative measures. She may try gabapentin dosing 100 mg nightly escalating to 300 mg at night over 3 weeks to look for benefit to the right foot allodynia. That will not treat the right foot drop. We discussed that realistically.  I would offer a right or bilateral L4-5 laminotomy for decompression of stenosis-spine narrowing."   HPI CC: Low back and Rightbuttock pain Outpatient therapy with PT dry needling very helpful, Right flank and buttock Patient had questions about her mid back pain and etiology.  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  There was a short-term relief. Reviewed notes from [facility], Dr. Roderic Ovens as well as [facility], Dr. Orlie Pollen Pain Inventory Average Pain 7 Pain Right Now 6 My pain is constant, dull, tingling and aching  In the last 24 hours, has pain interfered with the following? General activity 7 Relation with others 3 Enjoyment of life 4 What TIME of day is your pain at its worst? morning, daytime, evening Sleep (in general) Fair  Pain is worse with: walking and standing Pain improves with: rest, heat/ice, therapy/exercise, pacing activities and medication Relief from Meds: 5  Mobility how many minutes can you walk? 10-12 ability to climb steps?  no do you drive?  yes Do you have any goals in this area?   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  yes  Neuro/Psych bladder control problems weakness numbness tingling trouble walking  Prior Studies new  Physicians involved in your care new   History reviewed. No pertinent family history. Social History   Socioeconomic History  . Marital status: Married    Spouse name: Not on file  . Number of children: Not on file  . Years of education: Not on file  . Highest education level: Not on file  Occupational History  . Not on file  Social Needs  . Financial resource strain: Not on file  . Food insecurity:    Worry: Not on file    Inability: Not on file  . Transportation needs:    Medical: Not on file    Non-medical: Not on file  Tobacco Use  . Smoking status: Never Smoker  . Smokeless tobacco: Never Used  Substance and Sexual Activity  . Alcohol use: No  . Drug use: No  . Sexual activity: Not on file  Lifestyle  . Physical activity:    Days per week: Not on file    Minutes per session: Not on file  . Stress: Not on file  Relationships  . Social connections:    Talks on phone: Not on file    Gets together: Not on file    Attends religious service: Not on file    Active member of club or organization:  Not on file    Attends meetings of clubs or organizations: Not on file    Relationship status: Not on file  Other Topics Concern  . Not on file  Social History Narrative  . Not on file   Past Surgical History:  Procedure Laterality Date  . ABDOMINAL HYSTERECTOMY    . KNEE SURGERY     Past Medical History:  Diagnosis Date  . Bronchitis    BP (!) 174/99   Pulse 74   Ht 5\' 6"  (1.676 m)   Wt 281 lb (127.5 kg)   SpO2 98%   BMI 45.35 kg/m   Opioid Risk Score:   Fall Risk Score:  `1  Depression screen PHQ 2/9  No flowsheet data found.  Review of Systems  Constitutional:  Negative.   HENT: Negative.   Eyes: Negative.   Respiratory: Positive for apnea and wheezing.   Cardiovascular: Positive for leg swelling.  Endocrine: Negative.   Genitourinary: Positive for difficulty urinating.  Musculoskeletal: Positive for arthralgias, back pain, gait problem and neck pain.  Skin: Negative.   Allergic/Immunologic: Negative.   Neurological: Positive for weakness and numbness.       Tingling       Objective:   Physical Exam  Constitutional: She is oriented to person, place, and time. She appears well-developed and well-nourished.  HENT:  Head: Normocephalic and atraumatic.  Eyes: Pupils are equal, round, and reactive to light. EOM are normal.  Neck: Normal range of motion.  Cardiovascular: Normal rate, regular rhythm and normal heart sounds.  No murmur heard. Pulmonary/Chest: Effort normal and breath sounds normal. No respiratory distress.  Abdominal: Soft. Bowel sounds are normal. She exhibits no distension. There is no tenderness.  Musculoskeletal:  Patient has tenderness over the right PSIS area There is also tenderness around the T8 paraspinal area to palpation She has good cervical range of motion negative Spurling's Lumbar range of motion is 50% flexion extension lateral tension and bending.  Neurological: She is alert and oriented to person, place, and time. Coordination and gait normal.  5/5 bilateral deltoid, bicep, tricep, grip, hip flexor, knee extensor, ankle dorsiflexor and plantar flexor Sensation intact to light touch bilateral C5 C6-C7-C8 L2-L3-L4 L5-S1 dermatomal distribution. Pinprick sensation intact   Skin: Skin is warm and dry.  Psychiatric: She has a normal mood and affect.  Nursing note and vitals reviewed.         Assessment & Plan:  1.  Lumbar pain has sensory paresthesias in the right foot from L4-5 stenosis.  We discussed that there is no good medication that helps with the paresthesias unless they are painful in which case  gabapentin may be Helpful.  We discussed that injections such as epidurals may help alleviate some painful sensations on at least a short-term basis.  We discussed that increasing motor weakness in the foot and ankle area would be reasons to consider surgery more urgently  Will make referral to outpatient physical therapy, will request dry needling to the piriformis area also thoracolumbar stabilization increasing strength of Thoracic extensor muscles  2.  Mid back pain appears to be myofascial likely postural related Recommend trigger point injection into the right T8 paraspinal area  Trigger Point Injection  Indication: Thoracic myofascial pain not relieved by medication management and other conservative care.  Informed consent was obtained after describing risk and benefits of the procedure with the patient, this includes bleeding, bruising, infection and medication side effects.  The patient wishes to proceed and has given  written consent.  The patient was placed in a seated position.  The right T8 paraspinal area was marked and prepped with Betadine.  It was entered with a 25-gauge 1-1/2 inch needle and 1 mL of 1% lidocaine was injected into each of 1 trigger points, after negative draw back for blood.  The patient tolerated the procedure well.  Post procedure instructions were given.

## 2018-01-22 NOTE — Addendum Note (Signed)
Addended by: Erick ColaceKIRSTEINS, Yael Coppess E on: 01/22/2018 04:12 PM   Modules accepted: Level of Service

## 2018-02-05 ENCOUNTER — Ambulatory Visit: Payer: Non-veteran care | Admitting: Physical Therapy

## 2018-02-13 ENCOUNTER — Other Ambulatory Visit: Payer: Self-pay

## 2018-02-13 ENCOUNTER — Ambulatory Visit
Payer: No Typology Code available for payment source | Attending: Physical Medicine & Rehabilitation | Admitting: Physical Therapy

## 2018-02-13 ENCOUNTER — Encounter: Payer: Self-pay | Admitting: Physical Therapy

## 2018-02-13 DIAGNOSIS — M25551 Pain in right hip: Secondary | ICD-10-CM | POA: Diagnosis present

## 2018-02-13 DIAGNOSIS — R262 Difficulty in walking, not elsewhere classified: Secondary | ICD-10-CM | POA: Insufficient documentation

## 2018-02-13 DIAGNOSIS — M5441 Lumbago with sciatica, right side: Secondary | ICD-10-CM | POA: Insufficient documentation

## 2018-02-13 DIAGNOSIS — G8929 Other chronic pain: Secondary | ICD-10-CM | POA: Diagnosis present

## 2018-02-13 DIAGNOSIS — M6281 Muscle weakness (generalized): Secondary | ICD-10-CM | POA: Insufficient documentation

## 2018-02-14 NOTE — Therapy (Signed)
[facility] [address] [place], [place], [address] Phone: [phone]   Fax:  [phone]  Physical Therapy Evaluation  Patient Details  Name: Connie Cortez MRN: [id number] Date of Birth: 1960-09-22 Referring Provider (PT): Dr Shelda Altes    Encounter Date: 02/13/2018  PT End of Session - 02/13/18 1651    Visit Number  1    Number of Visits  16    Date for PT Re-Evaluation  04/10/18    Authorization Type  [facility] 15 visits     PT Start Time  1547    PT Stop Time  1630    PT Time Calculation (min)  43 min    Activity Tolerance  Patient tolerated treatment well    Behavior During Therapy  [facility] for tasks assessed/performed       Past Medical History:  Diagnosis Date  . Bronchitis     Past Surgical History:  Procedure Laterality Date  . ABDOMINAL HYSTERECTOMY    . KNEE SURGERY      There were no vitals filed for this visit.   Subjective Assessment - 02/13/18 1553    Subjective  Patient reports continuedlower back pain. She had some improvement with therapy the last time she was here in January but the pain remainedabout a 5/10. She feels like now she is having mid back pain that is effecting her posture. She would like to try a second round of therpy to work on needling and postrual correction.     Limitations  Standing;Walking    How long can you stand comfortably?  as the day goes on the pain gets worse     How long can you walk comfortably?  depends on the time of the day    Currently in Pain?  Yes    Pain Score  6     Pain Orientation  Right    Pain Descriptors / Indicators  Aching    Pain Radiating Towards  weakness going down the right leg     Pain Onset  More than a month ago    Pain Frequency  Constant    Aggravating Factors   walking throughout the day; Steady pain that increases as the day goes on;     Pain Relieving Factors  rest, ice and heat     Effect of Pain on Daily Activities  difficulty  walking thoughout the day          [facility] PT Assessment - 02/14/18 0001      Assessment   Medical Diagnosis  Low Back pain with right sided radiculopathy     Referring Provider (PT)  Dr Shelda Altes     Onset Date/Surgical Date  --   Since 1986 in 2014 became worse    Hand Dominance  Right    Prior Therapy  Yes in January 2019      Precautions   Precautions  None      Restrictions   Weight Bearing Restrictions  No      Balance Screen   Has the patient fallen in the past 6 months  No    Has the patient had a decrease in activity level because of a fear of falling?   No    Is the patient reluctant to leave their home because of a fear of falling?   No      Home Environment   Additional Comments  Steps at her house. Stairs hurt her knees. Has to navigate them  sideways       Prior Function   Level of Independence  Independent    Vocation  Full time employment    Vocation Requirements  Still doing 10-12 hours a week of work in [employer];     Leisure  Would like to get back to hiking and walking       Cognition   Overall Cognitive Status  Within Functional Limits for tasks assessed    Attention  Focused    Focused Attention  Appears intact    Memory  Appears intact    Awareness  Appears intact    Problem Solving  Appears intact      Observation/Other Assessments   Focus on Therapeutic Outcomes (FOTO)   66% limitation       Sensation   Light Touch  Appears Intact    Additional Comments  weakness reported in her ankle joint      Coordination   Gross Motor Movements are Fluid and Coordinated  Yes    Fine Motor Movements are Fluid and Coordinated  Yes      Posture/Postural Control   Posture Comments  flexed trunk; Sits in a posterior peovic tilt      AROM   Overall AROM Comments  cervical trotation to the left causes significnat mid back pain     Lumbar Flexion  40    Lumbar Extension  20    Lumbar - Right Side Bend  worse going to the right     Lumbar - Left Side  Bend  left side bend increase right lower back pain     Lumbar - Right Rotation  turning to the right increases mid back pain       PROM   Overall PROM Comments  pain with end range right hip flexion       Strength   Overall Strength Comments  left 5/5     Right/Left Hip  Right    Right Hip Flexion  4+/5    Right Hip ABduction  5/5    Right Hip ADduction  5/5    Right/Left Knee  Right    Right Knee Flexion  4+/5    Right Knee Extension  5/5    Right/Left Ankle  Right    Right Ankle Dorsiflexion  4+/5    Right Ankle Plantar Flexion  5/5    Right Ankle Inversion  4+/5    Right Ankle Eversion  5/5      Flexibility   Soft Tissue Assessment /Muscle Length  yes    Hamstrings  90/90 right limited 45 degrees left 28 degrees       Palpation   Spinal mobility  unable to get patient onher stomach     Palpation comment  spasming into left lower lumbar parapsinals; right gluteals; right lateral hip; tender around the right greater trochanter       Ambulation/Gait   Gait Comments  lateral movement with ambualtion                 Objective measurements completed on examination: See above findings.      [facility] Adult PT Treatment/Exercise - 02/14/18 0001      Exercises   Exercises  Lumbar      Lumbar Exercises: Stretches   Active Hamstring Stretch Limitations  seated 2x20 sec each side       Lumbar Exercises: Standing   Other Standing Lumbar Exercises  shoulder extension 2x10 yellow with cuing for posture and abdominal breathing  Other Standing Lumbar Exercises  standing scpa retraction x10 with cuing for technique and abdominal breathing              PT Education - 02/14/18 0933    Education Details  reviewed HEP and symptom mangement     Person(s) Educated  Patient    Methods  Explanation;Demonstration;Tactile cues;Verbal cues    Comprehension  Returned demonstration;Verbalized understanding;Verbal cues required;Tactile cues required       PT Short Term  Goals - 02/13/18 1658      PT SHORT TERM GOAL #1   Title  Patient will demostrate 5/5 gross bilateral lower extremity strength     Time  4    Period  Weeks    Status  New    Target Date  03/14/18      PT SHORT TERM GOAL #2   Title  Patient will be independent with basic HEP     Time  4    Period  Weeks    Status  New    Target Date  03/14/18      PT SHORT TERM GOAL #3   Title  Patient will report decreased tenderness to palpation in her right lumbar spine and right lateral hip     Time  4    Period  Weeks    Status  New    Target Date  03/14/18        PT Long Term Goals - 02/14/18 0952      PT LONG TERM GOAL #1   Title  Patient will walk at the store for 45 minutes without self report of increased right leg fatigue    Time  8    Period  Weeks    Status  New    Target Date  04/11/18      PT LONG TERM GOAL #2   Title  Patient will demonstrate a 51% limitation on FOTO     Time  8    Period  Weeks    Status  New      PT LONG TERM GOAL #3   Title  Patient will sit for 1 hour without cuing with good posture in order to decrease mid and lower back pain     Time  8    Period  Weeks    Status  New    Target Date  04/11/18             Plan - 02/13/18 1653    Clinical Impression Statement  Patient is a 57 year old female with right lower back pain and right hip pain. She reports increased weakness in her right leg when she walks. Only minor strength deficits noted with MMT. Per patient her wekaness is more functional. She has     History and Personal Factors relevant to plan of care:  right knee pain; right piriformis syndrome     Clinical Presentation  Evolving    Clinical Presentation due to:  increasing weakness in her leg and pain on the right side     Clinical Decision Making  Moderate    Rehab Potential  Good    Clinical Impairments Affecting Rehab Potential  long standing low back and hip pain. Progressinve LE wekaness with functional activity     PT  Frequency  2x / week    PT Duration  8 weeks    PT Treatment/Interventions  ADLs/Self Care Home Management;Cryotherapy;Electrical Stimulation;Iontophoresis 4mg /ml Dexamethasone;Ultrasound;Moist Heat;DME Instruction;Gait training;Stair training;Functional mobility training;Therapeutic exercise;Neuromuscular  re-education;Therapeutic activities;Patient/family education;Compression bandaging;Taping;Dry needling;Passive range of motion;Manual techniques    PT Next Visit Plan  consder ankle strengthening but may not be that beneficial, review current HEP; ry needling; manyual therapy: SI mobilizations    PT Home Exercise Plan  hamstring stretch; lat pull down; shoulder extension     Consulted and Agree with Plan of Care  Patient       Patient will benefit from skilled therapeutic intervention in order to improve the following deficits and impairments:     Visit Diagnosis: Chronic right-sided low back pain with right-sided sciatica - Plan: PT plan of care cert/re-cert  Difficulty in walking, not elsewhere classified - Plan: PT plan of care cert/re-cert  Pain in right hip - Plan: PT plan of care cert/re-cert  Muscle weakness (generalized) - Plan: PT plan of care cert/re-cert     Problem List Patient Active Problem List   Diagnosis Date Noted  . Chronic right-sided thoracic back pain 01/22/2018  . Piriformis syndrome of right side 01/22/2018    Dessie Comaavid J Stelios Kirby PT DPT  02/14/2018, 11:01 AM  [facility] [address] [place], [place], [address] Phone: [phone]   Fax:  [phone]  Name: Connie Cortez MRN: [id number] Date of Birth: 03-30-1960

## 2018-02-28 ENCOUNTER — Encounter: Payer: Self-pay | Admitting: Physical Therapy

## 2018-02-28 ENCOUNTER — Ambulatory Visit: Payer: No Typology Code available for payment source | Admitting: Physical Therapy

## 2018-02-28 DIAGNOSIS — M25551 Pain in right hip: Secondary | ICD-10-CM

## 2018-02-28 DIAGNOSIS — G8929 Other chronic pain: Secondary | ICD-10-CM

## 2018-02-28 DIAGNOSIS — M6281 Muscle weakness (generalized): Secondary | ICD-10-CM

## 2018-02-28 DIAGNOSIS — R262 Difficulty in walking, not elsewhere classified: Secondary | ICD-10-CM

## 2018-02-28 DIAGNOSIS — M5441 Lumbago with sciatica, right side: Principal | ICD-10-CM

## 2018-02-28 DIAGNOSIS — M545 Low back pain: Secondary | ICD-10-CM | POA: Insufficient documentation

## 2018-02-28 NOTE — Therapy (Signed)
[facility] [address] [place], [place], [address] Phone: [phone]   Fax:  [phone]  Physical Therapy Treatment  Patient Details  Name: [name] MRN: [id number] Date of Birth: Jun 30, 1960 Referring Provider (PT): Dr Shelda AltesNadrew Kierstens    Encounter Date: 02/28/2018  PT End of Session - 02/28/18 1625    Visit Number  2    Number of Visits  16    Date for PT Re-Evaluation  04/10/18    Authorization Type  [place] 15 visits     PT Start Time  1625    PT Stop Time  1711    PT Time Calculation (min)  46 min    Activity Tolerance  Patient tolerated treatment well    Behavior During Therapy  [facility] for tasks assessed/performed       Past Medical History:  Diagnosis Date  . Bronchitis     Past Surgical History:  Procedure Laterality Date  . ABDOMINAL HYSTERECTOMY    . KNEE SURGERY      There were no vitals filed for this visit.  Subjective Assessment - 02/28/18 1625    Subjective  Walking with forward bend because being upright causes the pain. SIJ stays around level of 5 independent of activity.     Currently in Pain?  Yes    Pain Score  7     Pain Location  Back    Pain Orientation  Right;Lower    Pain Descriptors / Indicators  Aching;Dull                       [facility] Adult PT Treatment/Exercise - 02/28/18 0001      Lumbar Exercises: Stretches   Passive Hamstring Stretch  Right;Left;2 reps;30 seconds    Passive Hamstring Stretch Limitations  supine with strap- lateral bias    Piriformis Stretch  Left;Right;30 seconds      Lumbar Exercises: Aerobic   Nustep  5 min L5 Ue & LE      Manual Therapy   Manual Therapy  Soft tissue mobilization    Manual therapy comments  skilled palpation and monitoring during TPDN    Soft tissue mobilization  Rt pririformis, glutmed/min/TFL       Trigger Point Dry Needling - 02/28/18 1655    Consent Given?  Yes    Education Handout Provided  --   verbal  education   Muscles Treated Lower Body  Gluteus minimus;Piriformis    Gluteus Minimus Response  Twitch response elicited;Palpable increased muscle length    Piriformis Response  Twitch response elicited;Palpable increased muscle length           PT Education - 02/28/18 1711    Education Details  TPDN and expected outcomes, cascade effects of msk system, importance of movement     Person(s) Educated  Patient    Methods  Explanation    Comprehension  Verbalized understanding;Need further instruction       PT Short Term Goals - 02/13/18 1658      PT SHORT TERM GOAL #1   Title  Patient will demostrate 5/5 gross bilateral lower extremity strength     Time  4    Period  Weeks    Status  New    Target Date  03/14/18      PT SHORT TERM GOAL #2   Title  Patient will be independent with basic HEP     Time  4    Period  Weeks  Status  New    Target Date  03/14/18      PT SHORT TERM GOAL #3   Title  Patient will report decreased tenderness to palpation in her right lumbar spine and right lateral hip     Time  4    Period  Weeks    Status  New    Target Date  03/14/18        PT Long Term Goals - 02/14/18 0952      PT LONG TERM GOAL #1   Title  Patient will walk at the store for 45 minutes without self report of increased right leg fatigue    Time  8    Period  Weeks    Status  New    Target Date  04/11/18      PT LONG TERM GOAL #2   Title  Patient will demonstrate a 51% limitation on FOTO     Time  8    Period  Weeks    Status  New      PT LONG TERM GOAL #3   Title  Patient will sit for 1 hour without cuing with good posture in order to decrease mid and lower back pain     Time  8    Period  Weeks    Status  New    Target Date  04/11/18            Plan - 02/28/18 1705    Clinical Impression Statement  concordant pain found with palpation to gut min/med and reduced with DN. pt reported ability to perform a full stride without feeling that her hip was  hiking following DN.    PT Treatment/Interventions  ADLs/Self Care Home Management;Cryotherapy;Electrical Stimulation;Iontophoresis 4mg /ml Dexamethasone;Ultrasound;Moist Heat;DME Instruction;Gait training;Stair training;Functional mobility training;Therapeutic exercise;Neuromuscular re-education;Therapeutic activities;Patient/family education;Compression bandaging;Taping;Dry needling;Passive range of motion;Manual techniques    PT Next Visit Plan  outcome of DN?. consider ankle strengthening? SIJ stability    PT Home Exercise Plan  hamstring stretch; lat pull down; shoulder extension     Consulted and Agree with Plan of Care  Patient       Patient will benefit from skilled therapeutic intervention in order to improve the following deficits and impairments:     Visit Diagnosis: Chronic right-sided low back pain with right-sided sciatica  Difficulty in walking, not elsewhere classified     Problem List Patient Active Problem List   Diagnosis Date Noted  . Chronic right-sided thoracic back pain 01/22/2018  . Piriformis syndrome of right side 01/22/2018    Lacrecia Delval C. Gyan Cambre PT, DPT 02/28/18 5:13 PM   [facility] [address] [place], [place], [address] Phone: [phone]   Fax:  [phone]  Name: Munachiso Leonhart MRN: [id number] Date of Birth: 1960/09/10

## 2018-03-01 ENCOUNTER — Encounter

## 2018-03-05 ENCOUNTER — Other Ambulatory Visit: Payer: Self-pay

## 2018-03-05 ENCOUNTER — Encounter: Payer: Self-pay | Admitting: Physical Therapy

## 2018-03-05 ENCOUNTER — Encounter: Payer: Self-pay | Admitting: Physical Medicine & Rehabilitation

## 2018-03-05 ENCOUNTER — Ambulatory Visit: Payer: No Typology Code available for payment source | Admitting: Physical Therapy

## 2018-03-05 ENCOUNTER — Ambulatory Visit (HOSPITAL_BASED_OUTPATIENT_CLINIC_OR_DEPARTMENT_OTHER): Payer: No Typology Code available for payment source | Admitting: Physical Medicine & Rehabilitation

## 2018-03-05 ENCOUNTER — Encounter: Payer: No Typology Code available for payment source | Attending: Physical Medicine & Rehabilitation

## 2018-03-05 VITALS — BP 144/87 | HR 84 | Ht 66.0 in | Wt 284.0 lb

## 2018-03-05 DIAGNOSIS — G5701 Lesion of sciatic nerve, right lower limb: Secondary | ICD-10-CM

## 2018-03-05 DIAGNOSIS — M6281 Muscle weakness (generalized): Secondary | ICD-10-CM

## 2018-03-05 DIAGNOSIS — M25551 Pain in right hip: Secondary | ICD-10-CM

## 2018-03-05 DIAGNOSIS — M5416 Radiculopathy, lumbar region: Secondary | ICD-10-CM

## 2018-03-05 DIAGNOSIS — M48061 Spinal stenosis, lumbar region without neurogenic claudication: Secondary | ICD-10-CM | POA: Diagnosis not present

## 2018-03-05 DIAGNOSIS — M545 Low back pain: Secondary | ICD-10-CM | POA: Diagnosis not present

## 2018-03-05 DIAGNOSIS — M546 Pain in thoracic spine: Secondary | ICD-10-CM

## 2018-03-05 DIAGNOSIS — R262 Difficulty in walking, not elsewhere classified: Secondary | ICD-10-CM

## 2018-03-05 DIAGNOSIS — M5441 Lumbago with sciatica, right side: Principal | ICD-10-CM

## 2018-03-05 DIAGNOSIS — G8929 Other chronic pain: Secondary | ICD-10-CM

## 2018-03-05 NOTE — Therapy (Signed)
[facility] [address] [place], [place], [address] Phone: [phone]   Fax:  [phone]  Physical Therapy Treatment  Patient Details  Name: Connie Cortez MRN: [id number] Date of Birth: 1960/09/19 Referring Provider (PT): Dr Shelda Altes    Encounter Date: 03/05/2018  PT End of Session - 03/05/18 1110    Visit Number  3    Number of Visits  15    Date for PT Re-Evaluation  04/10/18    Authorization Type  [place] 15 visits     PT Start Time  1105    PT Stop Time  1146    PT Time Calculation (min)  41 min    Activity Tolerance  Patient tolerated treatment well    Behavior During Therapy  [facility] for tasks assessed/performed       Past Medical History:  Diagnosis Date  . Bronchitis     Past Surgical History:  Procedure Laterality Date  . ABDOMINAL HYSTERECTOMY    . KNEE SURGERY      There were no vitals filed for this visit.  Subjective Assessment - 03/05/18 1110    Subjective  Feeling better, my hips seem to have stayed level.     Pain Location  Back    Pain Orientation  Right;Lower    Pain Descriptors / Indicators  Aching                       [facility] Adult PT Treatment/Exercise - 03/05/18 0001      Lumbar Exercises: Stretches   Passive Hamstring Stretch  Right;Left;2 reps;30 seconds    Passive Hamstring Stretch Limitations  seated EOB    Lower Trunk Rotation  5 reps    Piriformis Stretch  Right;Left;30 seconds    Gastroc Stretch  Right;Left;2 reps;30 seconds      Lumbar Exercises: Aerobic   Stationary Bike  L3 5 min      Lumbar Exercises: Standing   Functional Squats Limitations  mini squats on airex    Forward Lunge Limitations  resisted opp arm reach fwd red tband    Other Standing Lumbar Exercises  on airex A/P weight shift    Other Standing Lumbar Exercises  NBOS balance EO/EC      Lumbar Exercises: Supine   AB Set Limitations  holding 3 breaths ball bw knees    Clam  15 reps     Clam Limitations  green tband    Bridge with [employer]  15 reps    Bridge with [employer] Limitations  cues for core               PT Short Term Goals - 02/13/18 1658      PT SHORT TERM GOAL #1   Title  Patient will demostrate 5/5 gross bilateral lower extremity strength     Time  4    Period  Weeks    Status  New    Target Date  03/14/18      PT SHORT TERM GOAL #2   Title  Patient will be independent with basic HEP     Time  4    Period  Weeks    Status  New    Target Date  03/14/18      PT SHORT TERM GOAL #3   Title  Patient will report decreased tenderness to palpation in her right lumbar spine and right lateral hip     Time  4  Period  Weeks    Status  New    Target Date  03/14/18        PT Long Term Goals - 02/14/18 0952      PT LONG TERM GOAL #1   Title  Patient will walk at the store for 45 minutes without self report of increased right leg fatigue    Time  8    Period  Weeks    Status  New    Target Date  04/11/18      PT LONG TERM GOAL #2   Title  Patient will demonstrate a 51% limitation on FOTO     Time  8    Period  Weeks    Status  New      PT LONG TERM GOAL #3   Title  Patient will sit for 1 hour without cuing with good posture in order to decrease mid and lower back pain     Time  8    Period  Weeks    Status  New    Target Date  04/11/18            Plan - 03/05/18 1146    Clinical Impression Statement  exercises to encourage core engagement to support lumbar spine. Did not have a need for DN today but will use PRN as POC progresses    PT Treatment/Interventions  ADLs/Self Care Home Management;Cryotherapy;Electrical Stimulation;Iontophoresis 4mg /ml Dexamethasone;Ultrasound;Moist Heat;DME Instruction;Gait training;Stair training;Functional mobility training;Therapeutic exercise;Neuromuscular re-education;Therapeutic activities;Patient/family education;Compression bandaging;Taping;Dry needling;Passive range of motion;Manual  techniques    PT Home Exercise Plan  hamstring stretch; lat pull down; shoulder extension, mini squats, fwd step+resisted reach    Consulted and Agree with Plan of Care  Patient       Patient will benefit from skilled therapeutic intervention in order to improve the following deficits and impairments:     Visit Diagnosis: Chronic right-sided low back pain with right-sided sciatica  Difficulty in walking, not elsewhere classified  Pain in right hip  Muscle weakness (generalized)  Chronic right-sided low back pain without sciatica     Problem List Patient Active Problem List   Diagnosis Date Noted  . Chronic right-sided thoracic back pain 01/22/2018  . Piriformis syndrome of right side 01/22/2018    Amyiah Gaba C. Keayra Graham PT, DPT 03/05/18 11:48 AM   [facility] [address] [place], [place], [address] Phone: [phone]   Fax:  [phone]  Name: Connie Cortez MRN: [id number] Date of Birth: [birth year]

## 2018-03-05 NOTE — Progress Notes (Signed)
Subjective:    Patient ID: Connie Cortez, female    DOB: 04-13-1960, 57 y.o.   MRN: [id number]  HPI  Dry needling to piriformis, very effective  Stretches side to side, clam shells, bridges  In pm does hip flexors stretches, hamstring stretches  Exercise class 3 times a week    Pain Inventory Average Pain 6 Pain Right Now 4 My pain is constant, dull, tingling and aching  In the last 24 hours, has pain interfered with the following? General activity 7 Relation with others 5 Enjoyment of life 6 What TIME of day is your pain at its worst? morning daytime evening Sleep (in general) Poor  Pain is worse with: walking, sitting, standing and some activites Pain improves with: rest, heat/ice, therapy/exercise, pacing activities, medication and injections Relief from Meds: 5  Mobility how many minutes can you walk? 12 ability to climb steps?  no do you drive?  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  no  Physicians involved in your care Any changes since last visit?  no   No family history on file. Social History   Socioeconomic History  . Marital status: Married    Spouse name: Not on file  . Number of children: Not on file  . Years of education: Not on file  . Highest education level: Not on file  Occupational History  . Not on file  Social Needs  . Financial resource strain: Not on file  . Food insecurity:    Worry: Not on file    Inability: Not on file  . Transportation needs:    Medical: Not on file    Non-medical: Not on file  Tobacco Use  . Smoking status: Never Smoker  . Smokeless tobacco: Never Used  Substance and Sexual Activity  . Alcohol use: No  . Drug use: No  . Sexual activity: Not on file  Lifestyle  . Physical activity:   Days per week: Not on file    Minutes per session: Not on file  . Stress: Not on file  Relationships  . Social connections:    Talks on phone: Not on file    Gets together: Not on file    Attends religious service: Not on file    Active member of club or organization: Not on file    Attends meetings of clubs or organizations: Not on file    Relationship status: Not on file  Other Topics Concern  . Not on file  Social History Narrative  . Not on file   Past Surgical History:  Procedure Laterality Date  . ABDOMINAL HYSTERECTOMY    . KNEE SURGERY     Past Medical History:  Diagnosis Date  . Bronchitis    BP (!) 144/87   Pulse 84   Ht 5\' 6"  (1.676 m)   Wt 284 lb (128.8 kg)   SpO2 97%   BMI 45.84 kg/m   Opioid Risk Score:   Fall Risk Score:  `1  Depression screen PHQ 2/9  Depression screen PHQ 2/9 01/22/2018  Decreased Interest 1  Down, Depressed, Hopeless 0  PHQ - 2 Score 1  Altered sleeping 2  Tired, decreased energy 1  Change in appetite 1  Feeling bad or failure about yourself  0  Trouble concentrating 0  Moving slowly or  fidgety/restless 2  Suicidal thoughts 0  PHQ-9 Score 7  Difficult doing work/chores Very difficult     Review of Systems  Constitutional: Negative.   HENT: Negative.   Eyes: Negative.   Respiratory: Negative.   Cardiovascular: Negative.   Gastrointestinal: Negative.   Endocrine: Negative.   Genitourinary: Negative.   Musculoskeletal: Negative.   Skin: Negative.   Allergic/Immunologic: Negative.   Neurological: Negative.   Hematological: Negative.   Psychiatric/Behavioral: Negative.   All other systems reviewed and are negative.      Objective:   Physical Exam Vitals signs and nursing note reviewed.  Constitutional:      Appearance: Normal appearance. She is obese.  HENT:     Head: Normocephalic and atraumatic.     Nose: Nose normal.     Mouth/Throat:     Mouth: Mucous membranes are moist.  Eyes:     General: No  scleral icterus.       Right eye: No discharge.        Left eye: No discharge.     Conjunctiva/sclera: Conjunctivae normal.  Musculoskeletal:     Right hip: She exhibits decreased range of motion. She exhibits no tenderness.     Left hip: She exhibits decreased range of motion. She exhibits no tenderness.     Comments: Reduce Bilateral hip external rotation  Gait without toe drag or knee instability no antalgia  Neurological:     General: No focal deficit present.     Mental Status: She is alert and oriented to person, place, and time.  Psychiatric:        Mood and Affect: Mood normal.        Behavior: Behavior normal.     RIght quad lumborum with tenderness along lateral aspect Tenderness Right 11th and 12th ribs at posterior axiallary line No tenderness along thoraci or lumbar paraspinals Lumbar ROM 75% flex/ext 50% lateral bending      Assessment & Plan:  1.  RIght buttocks pain multifactorial - sacroiliac , piriformis as well as gluteus medius syndrome.  Progressing with ROM , pain reduction in PT with strech and dry needling, starting to build core muscle strenght, cont PT, will re eval in 66mo.  Discussed anatomy using chart  2.  RIght lower thoracic pain, appears to be mainly myofascial will address this once #1 is doing better.  Likely has issues with quadratus lumborum  We discussed rec for weight loss ( has lost 7lb per pt reort)

## 2018-03-08 ENCOUNTER — Ambulatory Visit: Payer: No Typology Code available for payment source | Admitting: Physical Therapy

## 2018-03-11 ENCOUNTER — Ambulatory Visit: Payer: No Typology Code available for payment source | Admitting: Physical Therapy

## 2018-03-11 ENCOUNTER — Encounter: Payer: Self-pay | Admitting: Physical Therapy

## 2018-03-11 DIAGNOSIS — R262 Difficulty in walking, not elsewhere classified: Secondary | ICD-10-CM

## 2018-03-11 DIAGNOSIS — M5441 Lumbago with sciatica, right side: Principal | ICD-10-CM

## 2018-03-11 DIAGNOSIS — M545 Low back pain: Secondary | ICD-10-CM

## 2018-03-11 DIAGNOSIS — M6281 Muscle weakness (generalized): Secondary | ICD-10-CM

## 2018-03-11 DIAGNOSIS — M25551 Pain in right hip: Secondary | ICD-10-CM

## 2018-03-11 DIAGNOSIS — G8929 Other chronic pain: Secondary | ICD-10-CM

## 2018-03-11 NOTE — Therapy (Signed)
[facility] [address] [place], [place], [address] Phone: [phone]   Fax:  [phone]  Physical Therapy Treatment  Patient Details  Name: Connie Cortez MRN: [id number] Date of Birth: [birth year] Referring Provider (PT): Dr Shelda Altes    Encounter Date: 03/11/2018    Past Medical History:  Diagnosis Date  . Bronchitis     Past Surgical History:  Procedure Laterality Date  . ABDOMINAL HYSTERECTOMY    . KNEE SURGERY      There were no vitals filed for this visit.  Subjective Assessment - 03/11/18 1420    Subjective  Felt tender after new stretches. compression is less frequent, I do a lot of hamstring stretching, using SI belt is very helpful.     Currently in Pain?  Yes    Pain Location  Back   SIJ   Pain Orientation  Right;Lower    Pain Descriptors / Indicators  Aching    Aggravating Factors   long duration posture    Pain Relieving Factors  SI belt, heat                       [facility] Adult PT Treatment/Exercise - 03/11/18 0001      Lumbar Exercises: Aerobic   Nustep  5 min L5 UE & LE      Lumbar Exercises: Supine   Large Ball Abdominal Isometric  10 reps   2 sets   Large Ball Oblique Isometric  10 reps   each   Other Supine Lumbar Exercises  supine on roller: balance NBOS, hip adduction, chest press single 3lb weight, marching, clam red tband             PT Education - 03/11/18 1426    Education Details  use of SI belt and weening, importance of core contraction/strength, pilates for back pain class    Person(s) Educated  Patient    Methods  Explanation    Comprehension  Verbalized understanding;Need further instruction       PT Short Term Goals - 02/13/18 1658      PT SHORT TERM GOAL #1   Title  Patient will demostrate 5/5 gross bilateral lower extremity strength     Time  4    Period  Weeks    Status  New    Target Date  03/14/18      PT SHORT TERM GOAL #2    Title  Patient will be independent with basic HEP     Time  4    Period  Weeks    Status  New    Target Date  03/14/18      PT SHORT TERM GOAL #3   Title  Patient will report decreased tenderness to palpation in her right lumbar spine and right lateral hip     Time  4    Period  Weeks    Status  New    Target Date  03/14/18        PT Long Term Goals - 02/14/18 0952      PT LONG TERM GOAL #1   Title  Patient will walk at the store for 45 minutes without self report of increased right leg fatigue    Time  8    Period  Weeks    Status  New    Target Date  04/11/18      PT LONG TERM GOAL #2   Title  Patient will demonstrate a 51%  limitation on FOTO     Time  8    Period  Weeks    Status  New      PT LONG TERM GOAL #3   Title  Patient will sit for 1 hour without cuing with good posture in order to decrease mid and lower back pain     Time  8    Period  Weeks    Status  New    Target Date  04/11/18            Plan - 03/11/18 1458    Clinical Impression Statement  really enjoyed roller exercises and felt less tense. decreased pain so DN not utilized but will need to check QL at next visit.    PT Treatment/Interventions  ADLs/Self Care Home Management;Cryotherapy;Electrical Stimulation;Iontophoresis 4mg /ml Dexamethasone;Ultrasound;Moist Heat;DME Instruction;Gait training;Stair training;Functional mobility training;Therapeutic exercise;Neuromuscular re-education;Therapeutic activities;Patient/family education;Compression bandaging;Taping;Dry needling;Passive range of motion;Manual techniques    PT Next Visit Plan  check lower rib mobility & thoracic. continue roller exercises    PT Home Exercise Plan  hamstring stretch; lat pull down; shoulder extension, mini squats, fwd step+resisted reach    Consulted and Agree with Plan of Care  Patient       Patient will benefit from skilled therapeutic intervention in order to improve the following deficits and impairments:      Visit Diagnosis: Chronic right-sided low back pain with right-sided sciatica  Difficulty in walking, not elsewhere classified  Pain in right hip  Muscle weakness (generalized)  Chronic right-sided low back pain without sciatica     Problem List Patient Active Problem List   Diagnosis Date Noted  . Chronic right-sided thoracic back pain 01/22/2018  . Piriformis syndrome of right side 01/22/2018   Connie Cortez C. Connie Cortez PT, DPT 03/11/18 3:44 PM   [facility] [address] [place], [place], [address] Phone: [phone]   Fax:  [phone]  Name: Connie Cortez MRN: [id number] Date of Birth: August 04, 1960

## 2018-03-14 ENCOUNTER — Ambulatory Visit: Payer: No Typology Code available for payment source | Admitting: Physical Therapy

## 2018-03-14 ENCOUNTER — Encounter: Payer: Self-pay | Admitting: Physical Therapy

## 2018-03-14 DIAGNOSIS — M545 Low back pain: Secondary | ICD-10-CM

## 2018-03-14 DIAGNOSIS — R262 Difficulty in walking, not elsewhere classified: Secondary | ICD-10-CM

## 2018-03-14 DIAGNOSIS — M25551 Pain in right hip: Secondary | ICD-10-CM

## 2018-03-14 DIAGNOSIS — M6281 Muscle weakness (generalized): Secondary | ICD-10-CM

## 2018-03-14 DIAGNOSIS — G8929 Other chronic pain: Secondary | ICD-10-CM

## 2018-03-14 DIAGNOSIS — M5441 Lumbago with sciatica, right side: Principal | ICD-10-CM

## 2018-03-14 NOTE — Therapy (Signed)
[facility] Outpatient Rehabilitation [facility] [address] [place], [place], [address] Phone: [phone]   Fax:  [phone]  Physical Therapy Treatment  Patient Details  Name: Connie Cortez MRN: [id number] Date of Birth: Jul 06, 1960 Referring Provider (PT): Dr Shelda Altes    Encounter Date: 03/14/2018  PT End of Session - 03/14/18 1214    Visit Number  4    Number of Visits  15    Date for PT Re-Evaluation  04/10/18    Authorization Type  [place] 15 visits     PT Start Time  1145    PT Stop Time  1231    PT Time Calculation (min)  46 min    Activity Tolerance  Patient tolerated treatment well    Behavior During Therapy  [facility] for tasks assessed/performed       Past Medical History:  Diagnosis Date  . Bronchitis     Past Surgical History:  Procedure Laterality Date  . ABDOMINAL HYSTERECTOMY    . KNEE SURGERY      There were no vitals filed for this visit.  Subjective Assessment - 03/14/18 1151    Subjective  " I am always sore,     Currently in Pain?  Yes    Pain Score  6     Pain Location  Back    Pain Orientation  Right;Lower;Upper    Pain Descriptors / Indicators  Tightness    Pain Onset  More than a month ago    Pain Frequency  Constant                       [facility] Adult PT Treatment/Exercise - 03/14/18 1153      Self-Care   Self-Care  Posture    Posture  discussed setting up her work station to allow for more LUE use and avoid over using the R side to reduce R sided back pain      Lumbar Exercises: Stretches   Other Lumbar Stretch Exercise  rhomboid stretch 2 x 30 sec hold with chin tuck for added stretch      Lumbar Exercises: Aerobic   Nustep  5 min L5 UE & LE      Manual Therapy   Manual Therapy  Myofascial release;Joint mobilization;Taping    Manual therapy comments  skilled palpation and monitoring during TPDN    Joint Mobilization  T8-T11 central PA and R unilagtera mobs grade III, R T9-T11  costovertebral face mobs PA and associated rib sprining grade II-III    Soft tissue mobilization  IASTM along R thoracolumbar paraspinals    Myofascial Release  myofascial stretching/ rolling along R lumbar paraspinals    Kinesiotex  Inhibit Muscle      Kinesiotix   Inhibit Muscle   R thoracolumbar paraspinals       Trigger Point Dry Needling - 03/14/18 1213    Consent Given?  Yes    Muscles Treated Upper Body  Longissimus    Longissimus Response  Palpable increased muscle length;Twitch response elicited   R T8-T11          PT Education - 03/14/18 1239    Education Details  using the LUE more to reduce overuse of R paraspinals    Person(s) Educated  Patient    Methods  Explanation;Verbal cues    Comprehension  Verbalized understanding       PT Short Term Goals - 02/13/18 1658      PT SHORT TERM GOAL #1  Title  Patient will demostrate 5/5 gross bilateral lower extremity strength     Time  4    Period  Weeks    Status  New    Target Date  03/14/18      PT SHORT TERM GOAL #2   Title  Patient will be independent with basic HEP     Time  4    Period  Weeks    Status  New    Target Date  03/14/18      PT SHORT TERM GOAL #3   Title  Patient will report decreased tenderness to palpation in her right lumbar spine and right lateral hip     Time  4    Period  Weeks    Status  New    Target Date  03/14/18        PT Long Term Goals - 02/14/18 0952      PT LONG TERM GOAL #1   Title  Patient will walk at the store for 45 minutes without self report of increased right leg fatigue    Time  8    Period  Weeks    Status  New    Target Date  04/11/18      PT LONG TERM GOAL #2   Title  Patient will demonstrate a 51% limitation on FOTO     Time  8    Period  Weeks    Status  New      PT LONG TERM GOAL #3   Title  Patient will sit for 1 hour without cuing with good posture in order to decrease mid and lower back pain     Time  8    Period  Weeks    Status  New     Target Date  04/11/18            Plan - 03/14/18 1235    Clinical Impression Statement  continued pain focused onthe R thoracolumbar paraspinals with spasm noted along the R T9-T12 ribs. DN performed along R thoracolumbar paraspinals followed with IASTM techniques and mobs. trialed KT taping along the R thoracolumbar paraspinals for inhibition. end of session she noted reduced tension and but conitnued soreness.     Clinical Impairments Affecting Rehab Potential  long standing low back and hip pain. Progressinve LE wekaness with functional activity     PT Treatment/Interventions  ADLs/Self Care Home Management;Cryotherapy;Electrical Stimulation;Iontophoresis 4mg /ml Dexamethasone;Ultrasound;Moist Heat;DME Instruction;Gait training;Stair training;Functional mobility training;Therapeutic exercise;Neuromuscular re-education;Therapeutic activities;Patient/family education;Compression bandaging;Taping;Dry needling;Passive range of motion;Manual techniques    PT Next Visit Plan  rib/ throacic mobs (T9-T11)    PT Home Exercise Plan  hamstring stretch; lat pull down; shoulder extension, mini squats, fwd step+resisted reach    Consulted and Agree with Plan of Care  Patient       Patient will benefit from skilled therapeutic intervention in order to improve the following deficits and impairments:     Visit Diagnosis: Chronic right-sided low back pain with right-sided sciatica  Difficulty in walking, not elsewhere classified  Pain in right hip  Muscle weakness (generalized)  Chronic right-sided low back pain without sciatica     Problem List Patient Active Problem List   Diagnosis Date Noted  . Chronic right-sided thoracic back pain 01/22/2018  . Piriformis syndrome of right side 01/22/2018   Lulu RidingKristoffer Anmol Fleck PT, DPT, LAT, ATC  03/14/18  12:40 PM      [facility] [address] [place], [place],  [id number] Phone: [phone]    Fax:  [phone]  Name: Connie Cortez MRN: [id number] Date of Birth: 1961-02-11

## 2018-03-29 ENCOUNTER — Ambulatory Visit: Payer: No Typology Code available for payment source | Admitting: Physical Therapy

## 2018-03-29 DIAGNOSIS — M5441 Lumbago with sciatica, right side: Principal | ICD-10-CM

## 2018-03-29 DIAGNOSIS — M6281 Muscle weakness (generalized): Secondary | ICD-10-CM

## 2018-03-29 DIAGNOSIS — R262 Difficulty in walking, not elsewhere classified: Secondary | ICD-10-CM

## 2018-03-29 DIAGNOSIS — G8929 Other chronic pain: Secondary | ICD-10-CM

## 2018-03-29 DIAGNOSIS — M545 Low back pain: Secondary | ICD-10-CM

## 2018-03-29 DIAGNOSIS — M25551 Pain in right hip: Secondary | ICD-10-CM

## 2018-03-29 NOTE — Therapy (Signed)
[place], [place], [address] Phone: [phone]   Fax:  [phone]  Physical Therapy Treatment  Patient Details  Name: Connie Cortez MRN: [id number] Date of Birth: 04/14/1960 Referring Provider (PT): Dr Tish Frederickson    Encounter Date: 03/29/2018  PT End of Session - 03/29/18 1023    Visit Number  5    Number of Visits  15    Date for PT Re-Evaluation  04/10/18    Authorization Type  [place] 15 visits     PT Start Time  7510   pt arrived 6 min late   PT Stop Time  1100    PT Time Calculation (min)  39 min    Activity Tolerance  Patient tolerated treatment well    Behavior During Therapy  [facility] for tasks assessed/performed       Past Medical History:  Diagnosis Date  . Bronchitis     Past Surgical History:  Procedure Laterality Date  . ABDOMINAL HYSTERECTOMY    . KNEE SURGERY      There were no vitals filed for this visit.  Subjective Assessment - 03/29/18 1023    Subjective  "The last session really helped, and it is still tight in the upper part of the back"     Currently in Pain?  Yes    Pain Score  7     Pain Location  Back    Pain Orientation  Right    Pain Descriptors / Indicators  Tightness    Pain Type  Chronic pain    Pain Onset  More than a month ago    Aggravating Factors   long duration with posture    Pain Relieving Factors  SI belt                       [facility] Adult PT Treatment/Exercise - 03/29/18 1035      Lumbar Exercises: Stretches   Active Hamstring Stretch  3 reps;30 seconds   PNF contract/ relax     Lumbar Exercises: Supine   Straight Leg Raise  15 reps   x 2 sets     Manual Therapy   Manual Therapy  Muscle Energy Technique    Manual therapy comments  skilled palpation and monitoring during TPDN    Joint Mobilization  T8-T11 central PA and R unilagtera mobs grade III, R T9-T11 costovertebral face mobs PA and associated rib sprining grade  II-III    Soft tissue mobilization  IASTM along R thoracolumbar paraspinals    Muscle Energy Technique  resisted R hip flexion 1 x holding 5 seconds in sidelying    Kinesiotex  Inhibit Muscle      Kinesiotix   Inhibit Muscle   B thoracolumbar paraspinals       Trigger Point Dry Needling - 03/29/18 1027    Consent Given?  Yes    Education Handout Provided  --   given previously   Longissimus Response  Twitch response elicited;Palpable increased muscle length   R thoracic T7-T10          PT Education - 03/29/18 1113    Education Details  reviewed HEP and updated for hamstring stretching and SLR. educated on anatomy of the SIJ and effects of specific muscles.     Person(s) Educated  Patient    Methods  Explanation;Verbal cues;Handout    Comprehension  Verbalized understanding;Verbal cues required       PT Short Term Goals -  02/13/18 1658      PT SHORT TERM GOAL #1   Title  Patient will demostrate 5/5 gross bilateral lower extremity strength     Time  4    Period  Weeks    Status  New    Target Date  03/14/18      PT SHORT TERM GOAL #2   Title  Patient will be independent with basic HEP     Time  4    Period  Weeks    Status  New    Target Date  03/14/18      PT SHORT TERM GOAL #3   Title  Patient will report decreased tenderness to palpation in her right lumbar spine and right lateral hip     Time  4    Period  Weeks    Status  New    Target Date  03/14/18        PT Long Term Goals - 02/14/18 0952      PT LONG TERM GOAL #1   Title  Patient will walk at the store for 45 minutes without self report of increased right leg fatigue    Time  8    Period  Weeks    Status  New    Target Date  04/11/18      PT LONG TERM GOAL #2   Title  Patient will demonstrate a 51% limitation on FOTO     Time  8    Period  Weeks    Status  New      PT LONG TERM GOAL #3   Title  Patient will sit for 1 hour without cuing with good posture in order to decrease mid and lower  back pain     Time  8    Period  Weeks    Status  New    Target Date  04/11/18            Plan - 03/29/18 1114    Clinical Impression Statement  pt arrived 6 min late today and reports improvement of pain since the last session. continued TPDN focusing on R upper thoracic region. continues IASTM techniques and KT taping for inhibtion along bil paraspinals. worked on SIJ due to pt reporting increased pain which she demosntrated posterior innominate rotation on the R, which improved with stretching and MET. reviewed SIJ biomechanics. end of session she reported some stiffness but improvement in pain.     PT Treatment/Interventions  ADLs/Self Care Home Management;Cryotherapy;Electrical Stimulation;Iontophoresis 50m/ml Dexamethasone;Ultrasound;Moist Heat;DME Instruction;Gait training;Stair training;Functional mobility training;Therapeutic exercise;Neuromuscular re-education;Therapeutic activities;Patient/family education;Compression bandaging;Taping;Dry needling;Passive range of motion;Manual techniques    PT Next Visit Plan  rib/ throacic mobs (T9-T11), assess SIJ potential posterior rotation,     PT Home Exercise Plan  hamstring stretch; lat pull down; shoulder extension, mini squats, fwd step+resisted reach, SLR     Consulted and Agree with Plan of Care  Patient       Patient will benefit from skilled therapeutic intervention in order to improve the following deficits and impairments:     Visit Diagnosis: Chronic right-sided low back pain with right-sided sciatica  Difficulty in walking, not elsewhere classified  Pain in right hip  Muscle weakness (generalized)  Chronic right-sided low back pain without sciatica     Problem List Patient Active Problem List   Diagnosis Date Noted  . Chronic right-sided thoracic back pain 01/22/2018  . Piriformis syndrome of right side 01/22/2018   Shalese Strahan PT, DPT, LAT,  ATC  03/29/18  11:17 AM      [facility] Out[facility]  Rehabilitation [facility] [address] [place], [place], [address] Phone: [phone]   Fax:  [phone]  Name: Connie Cortez MRN: [id number] Date of Birth: 1960/07/02

## 2018-04-03 ENCOUNTER — Encounter: Payer: Self-pay | Admitting: Physical Therapy

## 2018-04-03 ENCOUNTER — Ambulatory Visit
Payer: No Typology Code available for payment source | Attending: Physical Medicine & Rehabilitation | Admitting: Physical Therapy

## 2018-04-03 DIAGNOSIS — M6281 Muscle weakness (generalized): Secondary | ICD-10-CM | POA: Diagnosis present

## 2018-04-03 DIAGNOSIS — M5441 Lumbago with sciatica, right side: Secondary | ICD-10-CM | POA: Insufficient documentation

## 2018-04-03 DIAGNOSIS — M545 Low back pain: Secondary | ICD-10-CM | POA: Diagnosis present

## 2018-04-03 DIAGNOSIS — R262 Difficulty in walking, not elsewhere classified: Secondary | ICD-10-CM | POA: Insufficient documentation

## 2018-04-03 DIAGNOSIS — G8929 Other chronic pain: Secondary | ICD-10-CM | POA: Insufficient documentation

## 2018-04-03 DIAGNOSIS — M25551 Pain in right hip: Secondary | ICD-10-CM | POA: Diagnosis present

## 2018-04-03 NOTE — Therapy (Signed)
[place], [place], [address] Phone: [phone]   Fax:  [phone]  Physical Therapy Treatment / Discharge summary   Patient Details  Name: Connie Cortez MRN: [id number] Date of Birth: September 08, 1960 Referring Provider (PT): Dr Jone Baseman    Encounter Date: 04/03/2018  PT End of Session - 04/03/18 1234    Visit Number  6    Number of Visits  15    Date for PT Re-Evaluation  04/10/18    PT Start Time  5400    PT Stop Time  1223    PT Time Calculation (min)  38 min    Activity Tolerance  Patient tolerated treatment well    Behavior During Therapy  [facility] for tasks assessed/performed       Past Medical History:  Diagnosis Date  . Bronchitis     Past Surgical History:  Procedure Laterality Date  . ABDOMINAL HYSTERECTOMY    . KNEE SURGERY      There were no vitals filed for this visit.  Subjective Assessment - 04/03/18 1149    Subjective  "Todays a good day, since the last session I've been able to maintain progress I have made"     Currently in Pain?  Yes    Pain Score  5     Pain Location  Back    Pain Orientation  Right         [facility] PT Assessment - 04/03/18 0001      Assessment   Medical Diagnosis  Low Back pain with right sided radiculopathy     Referring Provider (PT)  Dr Jone Baseman       Observation/Other Assessments   Focus on Therapeutic Outcomes (FOTO)   33% limited      AROM   Lumbar Flexion  60    Lumbar Extension  20    Lumbar - Right Side Bend  18    Lumbar - Left Side Bend  16                   [facility] Adult PT Treatment/Exercise - 04/03/18 0001      Self-Care   Posture  how to perform MTPR release techniques and tools that can assist with self treatment      Lumbar Exercises: Stretches   Active Hamstring Stretch  3 reps;Left;Right   sitting with PNF contract relax     Lumbar Exercises: Seated   Other Seated Lumbar Exercises  poster  thoracic extension over chair 2 x 10   with arms crossed and with shoulders flexed   Other Seated Lumbar Exercises  rows 1 x 10 with green theraband      Manual Therapy   Manual therapy comments  MTPR along R thoracic paraspinals             PT Education - 04/03/18 1233    Education Details  reviewed previously provided HEP and updated as well as provided updated resistance for exercise progression. Discussed benefits of continued strengthening progressing reps / sets to promote strength/ endurance.     Person(s) Educated  Patient    Methods  Explanation;Handout    Comprehension  Verbalized understanding       PT Short Term Goals - 04/03/18 1228      PT SHORT TERM GOAL #1   Title  Patient will demostrate 5/5 gross bilateral lower extremity strength     Time  4    Period  Weeks  Status  Partially Met      PT SHORT TERM GOAL #2   Title  Patient will be independent with basic HEP     Time  4    Period  Weeks    Status  Achieved      PT SHORT TERM GOAL #3   Title  Patient will report decreased tenderness to palpation in her right lumbar spine and right lateral hip     Period  Weeks    Status  Partially Met        PT Long Term Goals - 04/03/18 1229      PT LONG TERM GOAL #1   Title  Patient will walk at the store for 45 minutes without self report of increased right leg fatigue    Time  8    Period  Weeks    Status  Partially Met      PT LONG TERM GOAL #2   Title  Patient will demonstrate a 51% limitation on FOTO     Baseline  67% limited    Period  Weeks    Status  Not Met      PT LONG TERM GOAL #3   Title  Patient will sit for 1 hour without cuing with good posture in order to decrease mid and lower back pain     Time  8    Period  Weeks    Status  Partially Met            Plan - 04/03/18 1230    Clinical Impression Statement  Nelwyn has made great progress with physical therapy increasing trunk mobility. She reports 5/10 pain today which she  notes is manageable. She perofrmed all exercises well with on report of pain. reviewed and updated HEP. she met or partially met all goals except for LTG# 2. she is able to maintain and progress her current level of function indepedently and will be discharged from PT today.     PT Treatment/Interventions  ADLs/Self Care Home Management;Cryotherapy;Electrical Stimulation;Iontophoresis 54m/ml Dexamethasone;Ultrasound;Moist Heat;DME Instruction;Gait training;Stair training;Functional mobility training;Therapeutic exercise;Neuromuscular re-education;Therapeutic activities;Patient/family education;Compression bandaging;Taping;Dry needling;Passive range of motion;Manual techniques    PT Next Visit Plan  D/C    PT Home Exercise Plan  hamstring stretch; lat pull down; shoulder extension, mini squats, fwd step+resisted reach, SLR     Consulted and Agree with Plan of Care  Patient       Patient will benefit from skilled therapeutic intervention in order to improve the following deficits and impairments:     Visit Diagnosis: Chronic right-sided low back pain with right-sided sciatica  Difficulty in walking, not elsewhere classified  Pain in right hip  Muscle weakness (generalized)  Chronic right-sided low back pain without sciatica     Problem List Patient Active Problem List   Diagnosis Date Noted  . Chronic right-sided thoracic back pain 01/22/2018  . Piriformis syndrome of right side 01/22/2018    LStarr Lake2/06/2018, 12:35 PM  [facility] [address]Phone: [phone]  Fax:  [phone] Name: Connie JonssonMRN: [id number] of Birth: 530-Oct-1962        PHYSICAL THERAPY DISCHARGE SUMMARY  Visits from Start of Care: 6  Current functional level related to goals / functional outcomes: See goals   Remaining deficits: R Thoracic paraspinal soreness. See assessment in  note.   Education / Equipment: HEP, theraband, posture,   Plan: Patient agrees to discharge.  Patient goals were partially met. Patient is being discharged due to being pleased with the current functional level.  ?????         Kathyann Spaugh PT, DPT, LAT, ATC  04/03/18  12:36 PM

## 2018-05-09 ENCOUNTER — Encounter: Payer: No Typology Code available for payment source | Attending: Physical Medicine & Rehabilitation

## 2018-05-09 ENCOUNTER — Other Ambulatory Visit: Payer: Self-pay

## 2018-05-09 ENCOUNTER — Encounter: Payer: Self-pay | Admitting: Physical Medicine & Rehabilitation

## 2018-05-09 ENCOUNTER — Ambulatory Visit (HOSPITAL_BASED_OUTPATIENT_CLINIC_OR_DEPARTMENT_OTHER): Payer: No Typology Code available for payment source | Admitting: Physical Medicine & Rehabilitation

## 2018-05-09 ENCOUNTER — Ambulatory Visit
Admission: RE | Admit: 2018-05-09 | Discharge: 2018-05-09 | Disposition: A | Discharge status: Home / Self Care | Payer: Non-veteran care | Source: Ambulatory Visit | Attending: Physical Medicine & Rehabilitation | Admitting: Physical Medicine & Rehabilitation

## 2018-05-09 ENCOUNTER — Ambulatory Visit: Payer: Non-veteran care | Admitting: Physical Medicine & Rehabilitation

## 2018-05-09 VITALS — BP 155/97 | HR 88 | Ht 66.0 in | Wt 283.0 lb

## 2018-05-09 DIAGNOSIS — G8929 Other chronic pain: Secondary | ICD-10-CM

## 2018-05-09 DIAGNOSIS — M25551 Pain in right hip: Secondary | ICD-10-CM

## 2018-05-09 DIAGNOSIS — M25511 Pain in right shoulder: Secondary | ICD-10-CM

## 2018-05-09 DIAGNOSIS — M545 Low back pain: Secondary | ICD-10-CM | POA: Diagnosis not present

## 2018-05-09 NOTE — Progress Notes (Signed)
Subjective:    Patient ID: Connie Cortez, female    DOB: 08-08-1960, 58 y.o.   MRN: [id number]  HPI 58 year old female with chronic buttock and low back pain.  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  She has tried sacroiliac injection which was helpful on a short-term basis. She is undergoing physical therapy at the current time she is doing strengthening as well as stretching of her pelvic girdle and lower extremities. She has some stress incontinence symptoms, dribble some urine when she first gets up.  We discussed adding Kegel's exercise again to her regimen In addition she has an area in the right mid thoracic spine along the paraspinal musculature that is tender.  She has some radiation along the thoracic paraspinal down to the lumbar paraspinal area.  Looking downward does aggravate this pain. She has had no lower extremity numbness or tingling no lower extremity weakness.  She remains independent with all her self-care and mobility does not require assistive device to ambulate. She has tried nonsteroidal anti-inflammatories as well as muscle relaxers. Reviewed physical therapy notes, recently discharged.  Is starting supervised exercise program. Pain Inventory Average Pain 6 Pain Right Now 6 My pain is constant, tingling and aching  In the last 24 hours, has pain interfered with the following? General activity 0 Relation with others 2 Enjoyment of life 2 What TIME of day is your pain at its worst? daytime Sleep (in general) Fair  Pain is worse with: walking, bending and some activites Pain improves with: rest, heat/ice and pacing activities Relief from Meds: 6  Mobility walk with assistance ability to climb steps?  no do you drive?  yes  Function employed # of hrs/week 2012  Neuro/Psych bladder control problems weakness numbness tingling trouble walking  Prior Studies Any changes since last visit?  no  Physicians involved in your  care Any changes since last visit?  no   No family history on file. Social History   Socioeconomic History  . Marital status: Married    Spouse name: Not on file  . Number of children: Not on file  . Years of education: Not on file  . Highest education level: Not on file  Occupational History  . Not on file  Social Needs  . Financial resource strain: Not on file  . Food insecurity:    Worry: Not on file    Inability: Not on file  . Transportation needs:    Medical: Not on file    Non-medical: Not on file  Tobacco Use  . Smoking status: Never Smoker  . Smokeless tobacco: Never Used  Substance and Sexual Activity  . Alcohol use: No  . Drug use: No  . Sexual activity: Not on file  Lifestyle  . Physical activity:    Days per week: Not on file    Minutes per session: Not on file  . Stress: Not on file  Relationships  . Social connections:    Talks on phone: Not on file    Gets together: Not on file    Attends religious service: Not on file    Active member of club or organization: Not on file    Attends meetings of clubs or organizations: Not on file    Relationship status: Not on file  Other Topics Concern  . Not on file  Social History Narrative  . Not on file   Past Surgical History:  Procedure Laterality Date  . ABDOMINAL HYSTERECTOMY    .  KNEE SURGERY     Past Medical History:  Diagnosis Date  . Bronchitis    BP (!) 155/97   Pulse 88   Ht 5\' 6"  (1.676 m)   Wt 283 lb (128.4 kg)   SpO2 97%   BMI 45.68 kg/m   Opioid Risk Score:   Fall Risk Score:  `1  Depression screen PHQ 2/9  Depression screen [facility] 2/9 03/05/2018 01/22/2018  Decreased Interest 1 1  Down, Depressed, Hopeless 0 0  PHQ - 2 Score 1 1  Altered sleeping - 2  Tired, decreased energy - 1  Change in appetite - 1  Feeling bad or failure about yourself  - 0  Trouble concentrating - 0  Moving slowly or fidgety/restless - 2  Suicidal thoughts - 0  PHQ-9 Score - 7  Difficult doing  work/chores - Very difficult     Review of Systems  Constitutional: Negative.   HENT: Negative.   Eyes: Negative.   Respiratory: Positive for apnea.   Cardiovascular: Negative.   Gastrointestinal: Negative.   Endocrine: Negative.   Genitourinary: Positive for difficulty urinating.  Musculoskeletal: Positive for arthralgias, back pain, gait problem, joint swelling and myalgias.  Skin: Negative.   Allergic/Immunologic: Negative.   Neurological: Positive for weakness and numbness.  Hematological: Negative.   Psychiatric/Behavioral: Negative.   All other systems reviewed and are negative.      Objective:   Physical Exam Vitals signs and nursing note reviewed.  Constitutional:      Appearance: She is obese.  HENT:     Head: Normocephalic and atraumatic.     Nose: Nose normal.     Mouth/Throat:     Mouth: Mucous membranes are moist.     Pharynx: Oropharynx is clear.  Eyes:     Conjunctiva/sclera: Conjunctivae normal.     Pupils: Pupils are equal, round, and reactive to light.  Musculoskeletal:     Comments: There is tenderness in the right medial scapular area around the angle of the scapula. Cervical spine range of motion normal thoracic spine range of motion normal lumbar spine range of motion is normal with flexion mildly reduced with lateral bending and extension. She has decreased range of motion bilateral hip internal and external rotation.  Neurological:     General: No focal deficit present.     Mental Status: She is alert and oriented to person, place, and time.     Sensory: Sensation is intact.     Motor: No weakness, tremor or atrophy.     Coordination: Coordination normal.     Comments: Motor strength is 5/5 bilateral hip flexor knee extensor ankle dorsiflexor Negative straight leg raising test  Psychiatric:        Mood and Affect: Mood normal.        Behavior: Behavior normal.           Assessment & Plan:  1.  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 2 buttocks hip and low back pain.  We discussed that underlying sacroiliac joint dysfunction may be making this more difficult to treat and may explain why she only gets this short-term relief with her physical therapy.  Discussed L5 dorsal ramus injection S1-S2-S3 lateral branch blocks.  She will consider this.  In addition we discussed her right hip greater than left hip range of motion deficit.  Given her age and body habitus would be at risk for osteoarthritis of the hip.  Will check x-rays.  Her chronic thoracic pain certainly may have underlying spondylitic component as well as some disc degeneration will check x-rays including lateral view

## 2018-06-20 ENCOUNTER — Encounter: Payer: Self-pay | Admitting: Physical Medicine & Rehabilitation

## 2018-06-20 ENCOUNTER — Encounter: Payer: No Typology Code available for payment source | Attending: Physical Medicine & Rehabilitation

## 2018-06-20 ENCOUNTER — Other Ambulatory Visit: Payer: Self-pay

## 2018-06-20 ENCOUNTER — Ambulatory Visit (HOSPITAL_BASED_OUTPATIENT_CLINIC_OR_DEPARTMENT_OTHER): Payer: No Typology Code available for payment source | Admitting: Physical Medicine & Rehabilitation

## 2018-06-20 VITALS — Ht 65.0 in | Wt 280.0 lb

## 2018-06-20 DIAGNOSIS — M40294 Other kyphosis, thoracic region: Secondary | ICD-10-CM

## 2018-06-20 DIAGNOSIS — M4694 Unspecified inflammatory spondylopathy, thoracic region: Secondary | ICD-10-CM | POA: Diagnosis not present

## 2018-06-20 DIAGNOSIS — G8929 Other chronic pain: Secondary | ICD-10-CM

## 2018-06-20 DIAGNOSIS — M533 Sacrococcygeal disorders, not elsewhere classified: Secondary | ICD-10-CM | POA: Diagnosis not present

## 2018-06-20 DIAGNOSIS — M545 Low back pain: Secondary | ICD-10-CM | POA: Insufficient documentation

## 2018-06-20 DIAGNOSIS — M40209 Unspecified kyphosis, site unspecified: Secondary | ICD-10-CM | POA: Insufficient documentation

## 2018-06-20 NOTE — Patient Instructions (Signed)
The next procedure will be diagnostic and determine whether a radiofrequency ablation may be of longer term benefit

## 2018-06-20 NOTE — Progress Notes (Signed)
Subjective:    Patient ID: Connie Cortez, female    DOB: 06-Dec-1960, 58 y.o.   MRN: [id number]  HPI CC:  Mid back and Right buttock pain  58 year old female with primary complaint of mid back pain and secondary complaint of right buttock and low back pain.  She has had these complaints for a number of years.  She has been to [facility] as well as [facility].  She was diagnosed with sacroiliac pain and underwent sacroiliac injections on at least 2 occasions under fluoroscopic guidance.  This produced a good short-term relief but no long-term benefit. Patient underwent recent imaging of the thoracic spine which demonstrates bridging osteophytes, as well as a mild S-shaped scoliosis.  In addition the patient does have evidence of thoracic kyphosis.  No compression fractures noted. Bilateral hip show mild degenerative changes There are no degenerative changes in the sacroiliac joint on the right side.  The patient has no pain radiating down the right leg.  She remains independent with all her self-care and mobility We discussed her medications she has started to use turmeric and has discontinued both Aleve as well as Celebrex.  She cannot tell a big difference between these treatment approaches Pain Inventory Average Pain 7 Pain Right Now 7 My pain is constant, dull, tingling and aching  In the last 24 hours, has pain interfered with the following? General activity 7 Relation with others 7 Enjoyment of life 7 What TIME of day is your pain at its worst? evening Sleep (in general) Poor  Pain is worse with: walking, bending, standing and some activites Pain improves with: rest, heat/ice and medication Relief from Meds: 4  Mobility walk without assistance ability to climb steps?  yes do you drive?  yes  Function employed # of hrs/week 10-15  Neuro/Psych bladder control problems numbness tingling anxiety  Prior Studies Any changes since last visit?   no  Physicians involved in your care Any changes since last visit?  no   No family history on file. Social History   Socioeconomic History   Marital status: Married    Spouse name: Not on file   Number of children: Not on file   Years of education: Not on file   Highest education level: Not on file  Occupational History   Not on file  Social Needs   Financial resource strain: Not on file   Food insecurity:    Worry: Not on file    Inability: Not on file   Transportation needs:    Medical: Not on file    Non-medical: Not on file  Tobacco Use   Smoking status: Never Smoker   Smokeless tobacco: Never Used  Substance and Sexual Activity   Alcohol use: No   Drug use: No   Sexual activity: Not on file  Lifestyle   Physical activity:    Days per week: Not on file    Minutes per session: Not on file   Stress: Not on file  Relationships   Social connections:    Talks on phone: Not on file    Gets together: Not on file    Attends religious service: Not on file    Active member of club or organization: Not on file    Attends meetings of clubs or organizations: Not on file    Relationship status: Not on file  Other Topics Concern   Not on file  Social History Narrative   Not on file   Past  Surgical History:  Procedure Laterality Date   ABDOMINAL HYSTERECTOMY     KNEE SURGERY     Past Medical History:  Diagnosis Date   Bronchitis    There were no vitals taken for this visit.  Opioid Risk Score:   Fall Risk Score:  `1  Depression screen PHQ 2/9  Depression screen [facility] 2/9 03/05/2018 01/22/2018  Decreased Interest 1 1  Down, Depressed, Hopeless 0 0  PHQ - 2 Score 1 1  Altered sleeping - 2  Tired, decreased energy - 1  Change in appetite - 1  Feeling bad or failure about yourself  - 0  Trouble concentrating - 0  Moving slowly or fidgety/restless - 2  Suicidal thoughts - 0  PHQ-9 Score - 7  Difficult doing work/chores - Very difficult      Review of Systems  Constitutional: Positive for fatigue.  HENT: Negative.   Eyes: Negative.   Respiratory: Negative.   Cardiovascular: Negative.   Gastrointestinal: Negative.   Endocrine: Negative.   Genitourinary: Positive for difficulty urinating.  Musculoskeletal: Positive for arthralgias and myalgias.  Skin: Negative.   Allergic/Immunologic: Negative.   Neurological: Positive for numbness.  Hematological: Negative.   Psychiatric/Behavioral: Negative.   All other systems reviewed and are negative.      Objective:   Physical Exam  Could not perform due to phone visit Speech is without dysarthria or aphasia Cognition is intact patient is alert       Assessment & Plan:  1.  Chronic mid back and buttock pain on the right side.  X-rays demonstrating bridging osteophytes in the thoracic spine as well as a mild scoliosis and a moderate kyphosis.  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 In addition patient has a chronic sacroiliac pain but no radiologic changes in the sacroiliac joint.  As discussed with the patient the thoracic radiologic findings as well as the sacroiliac pain may be explained by ankylosing spondylitis however this tends to be much more common in men that in females.  Nevertheless will order arthritis panel as well as HLA-B27.  If there are any abnormalities make referral to rheumatology. We discussed that intra-articular sacroiliac injection only gave her short-term relief.  We discussed the nerve block of the sacroiliac joint, L5 dorsal ramus S1-S2-S3 lateral branch blocks under fluoroscopic guidance as a diagnostic procedure using lidocaine only to evaluate whether radiofrequency of the same nerves would be of longer-term benefit i.e. 6 to 12 months. Discussed with patient Phone visit lasted 20 minutes Have ordered lab work which will review prior to the next procedure.  Return to clinic in 2 weeks

## 2018-07-02 ENCOUNTER — Telehealth: Payer: Self-pay | Admitting: *Deleted

## 2018-07-02 NOTE — Telephone Encounter (Signed)
Reviewed pre procedure checklist for Lateral Branch Block scheduled for 07/04/18 @1 :15.  She is on no blood thinners and is not diabetic. She does not require pre medication. No driver will be required.

## 2018-07-03 LAB — HLA-B27 ANTIGEN: HLA B27: NEGATIVE

## 2018-07-03 LAB — ARTHRITIS PANEL
Anti Nuclear Antibody (ANA): NEGATIVE
Rheumatoid fact SerPl-aCnc: 10.7 IU/mL (ref 0.0–13.9)
Sed Rate: 27 mm/hr (ref 0–40)
Uric Acid: 8.8 mg/dL — ABNORMAL HIGH (ref 2.5–7.1)

## 2018-07-04 ENCOUNTER — Other Ambulatory Visit: Payer: Self-pay

## 2018-07-04 ENCOUNTER — Encounter
Payer: No Typology Code available for payment source | Attending: Physical Medicine & Rehabilitation | Admitting: Physical Medicine & Rehabilitation

## 2018-07-04 VITALS — BP 152/98 | HR 75 | Temp 97.8°F | Ht 65.0 in | Wt 280.0 lb

## 2018-07-04 DIAGNOSIS — G8929 Other chronic pain: Secondary | ICD-10-CM | POA: Insufficient documentation

## 2018-07-04 DIAGNOSIS — M533 Sacrococcygeal disorders, not elsewhere classified: Secondary | ICD-10-CM | POA: Diagnosis not present

## 2018-07-04 NOTE — Progress Notes (Signed)
L5 dorsal ramus S1-S2-S3 lateral branch blocks under fluoroscopic guidance Right  side  Informed consent was obtained after describing risks and benefits of the procedure with patient these include bleeding bruising and infection.  He elects to proceed and has given written consent.  Patient placed prone on fluoroscopy table Betadine prep sterile drape a 25-gauge 1.5 inch needle was used to anesthetize skin and subcu tissue with 1% lidocaine 1 cc into each of 4 sites.  Then a 22-gauge 5" needle was inserted under fluoroscopic guidance for starting the S1 SAP sacral ala junction.  Bone contact made.  Isovue 200 x 0.5 mL demonstrated no intravascular uptake then 0.5 mL of 2% lidocaine was injected.  Then the lateral aspect of the S1, S2, S3 foramen was targeted.  Bone contact made out, Isovue-200 times 0.5 mL demonstrated no nerve root or intravascular uptake within 0.5 mL of 2% lidocaine solution was injected after negative drawback for blood.  Patient tolerated procedure well.  Postinjection instructions given. 

## 2018-07-04 NOTE — Progress Notes (Signed)
  PROCEDURE RECORD [facility]   Name: Connie Cortez DOB:1960/06/10 MRN: [id number]  Date:07/04/2018  Physician: Claudette Laws, MD    Nurse/CMA: Nedra Hai, CMA  Allergies:  Allergies  Allergen Reactions  . Sulfa Antibiotics Hives    Consent Signed: Yes.    Is patient diabetic? No.  CBG today?   Pregnant: No. LMP: No LMP recorded. Patient has had a hysterectomy. (age [age])  Anticoagulants: no Anti-inflammatory: no Antibiotics: no  Procedure: Right L5 Dorsal Ramus Lateral Branch Block S1.2.3 Position: Prone Start Time:1:43pm  End Time: 1:50pm Fluoro Time: 33  RN/CMA Doyal Saric,CMA Todd Argabright,CMA    Time 1:23pm 1:55pm    BP 152/98 146/85    Pulse 75 80    Respirations 16 16    O2 Sat 98 98    S/S 6/6 6/6    Pain Level 6/10 3/10     D/C home with husband, [name] & O X 3, D/C instructions reviewed, and sits independently.

## 2018-07-04 NOTE — Patient Instructions (Signed)
Sacroiliac nerve block injections performed today. ? ?The injection was done under x-ray guidance with contrast enhancement. ?This procedure has been performed to help reduce low back and buttocks pain as well as potentially hip pain. ?The duration of this injection is variable lasting from hours to  weeks ?If this injection produces a >50%  short term relief of pain, then a radiofrequency ablation of these same nerves may result in a 6mo + pain relief  ?

## 2018-08-02 ENCOUNTER — Ambulatory Visit: Payer: No Typology Code available for payment source | Admitting: Physical Medicine & Rehabilitation

## 2018-08-08 ENCOUNTER — Encounter
Payer: No Typology Code available for payment source | Attending: Physical Medicine & Rehabilitation | Admitting: Physical Medicine & Rehabilitation

## 2018-08-08 ENCOUNTER — Other Ambulatory Visit: Payer: Self-pay

## 2018-08-08 ENCOUNTER — Telehealth: Payer: Self-pay | Admitting: Physical Medicine & Rehabilitation

## 2018-08-08 ENCOUNTER — Encounter: Payer: Self-pay | Admitting: Physical Medicine & Rehabilitation

## 2018-08-08 VITALS — BP 156/89 | HR 73 | Temp 97.0°F | Ht 66.0 in | Wt 292.0 lb

## 2018-08-08 DIAGNOSIS — G8929 Other chronic pain: Secondary | ICD-10-CM

## 2018-08-08 DIAGNOSIS — M533 Sacrococcygeal disorders, not elsewhere classified: Secondary | ICD-10-CM | POA: Insufficient documentation

## 2018-08-08 MED ORDER — GABAPENTIN 300 MG PO CAPS: 300.0000 mg | ORAL_CAPSULE | Freq: Three times a day (TID) | ORAL | 0 refills | Status: AC

## 2018-08-08 NOTE — Progress Notes (Signed)
  PROCEDURE RECORD [facility]   Name: Connie Cortez DOB:Aug 20, 1960 MRN: [id number]  Date:08/08/2018  Physician: Alysia Penna, MD    Nurse/CMA: Bright CMA  Allergies:  Allergies  Allergen Reactions  . Sulfa Antibiotics Hives    Consent Signed: Yes.    Is patient diabetic? No.  CBG today? NA  Pregnant: No. LMP: No LMP recorded. Patient has had a hysterectomy. (age [age])  Anticoagulants: no Anti-inflammatory: no Antibiotics: no  Procedure: Right Sacroiliac RFA   Position: Prone   Start Time: 957am End Time: 1019am Fluoro Time: 62s  RN/CMA Bright CMA Bright CMA    Time 932am 1025am    BP 156/89 149/89    Pulse 73 61    Respirations 16 16    O2 Sat 98 97    S/S 6 6    Pain Level 4/10 0/10     D/C home with Husband, [name] & O X 3, D/C instructions reviewed, and sits independently.

## 2018-08-08 NOTE — Patient Instructions (Addendum)
You had a radio frequency procedure today This was done to alleviate joint pain in your lumbosacral area We injected lidocaine which is a local anesthetic.  You may experience soreness at the injection sites. You may also experienced some irritation of the nerves that were heated I'm recommending ice for 30 minutes every 2 hours as needed for the next 24-48 hours  Will place order for Gabapentin 300mg  1-2 every 6hr as needed for post procedure pain

## 2018-08-08 NOTE — Progress Notes (Signed)
L5 dorsal ramus S1-S2-S3 lateral branch blocks under fluoroscopic guidance Right  side  Informed consent was obtained after describing risks and benefits of the procedure with patient these include bleeding bruising and infection.  He elects to proceed and has given written consent.  Patient placed prone on fluoroscopy table Betadine prep sterile drape a 25-gauge 1.5 inch needle was used to anesthetize skin and subcu tissue with 1% lidocaine 1 cc into each of 4 sites.  Then a 22-gauge 5" needle was inserted under fluoroscopic guidance for starting the S1 SAP sacral ala junction.  Bone contact made.  Isovue 200 x 0.5 mL demonstrated no intravascular uptake then 0.5 mL of 2% lidocaine was injected.  Then the lateral aspect of the S1, S2, S3 foramen was targeted.  Bone contact made out, Isovue-200 times 0.5 mL demonstrated no nerve root or intravascular uptake within 0.5 mL of 2% lidocaine solution was injected after negative drawback for blood.  Patient tolerated procedure well.  Postinjection instructions given. 

## 2018-08-08 NOTE — Telephone Encounter (Signed)
This is a one day dose after RF. I have called the pharmacist and let him know it was not an error.

## 2018-08-08 NOTE — Telephone Encounter (Signed)
Roderic Palau with [facility] needs clarification of medication Gabapentin 1 day supply 300mg  1-2 pills 3 x day Qty 6.  Please call him at [phone].

## 2019-01-09 ENCOUNTER — Encounter: Payer: No Typology Code available for payment source | Admitting: Physical Medicine & Rehabilitation

## 2019-06-04 IMAGING — CR THORACIC SPINE 2 VIEWS
1 series · 1 of 1 positions shown · non-contrast
Comparison: CT 04/26/2013.  Chest x-ray 04/26/2013.

CLINICAL DATA: Mid spine pain for years.  No injury.

EXAM:
THORACIC SPINE 2 VIEWS

[w thoracic swimmers]
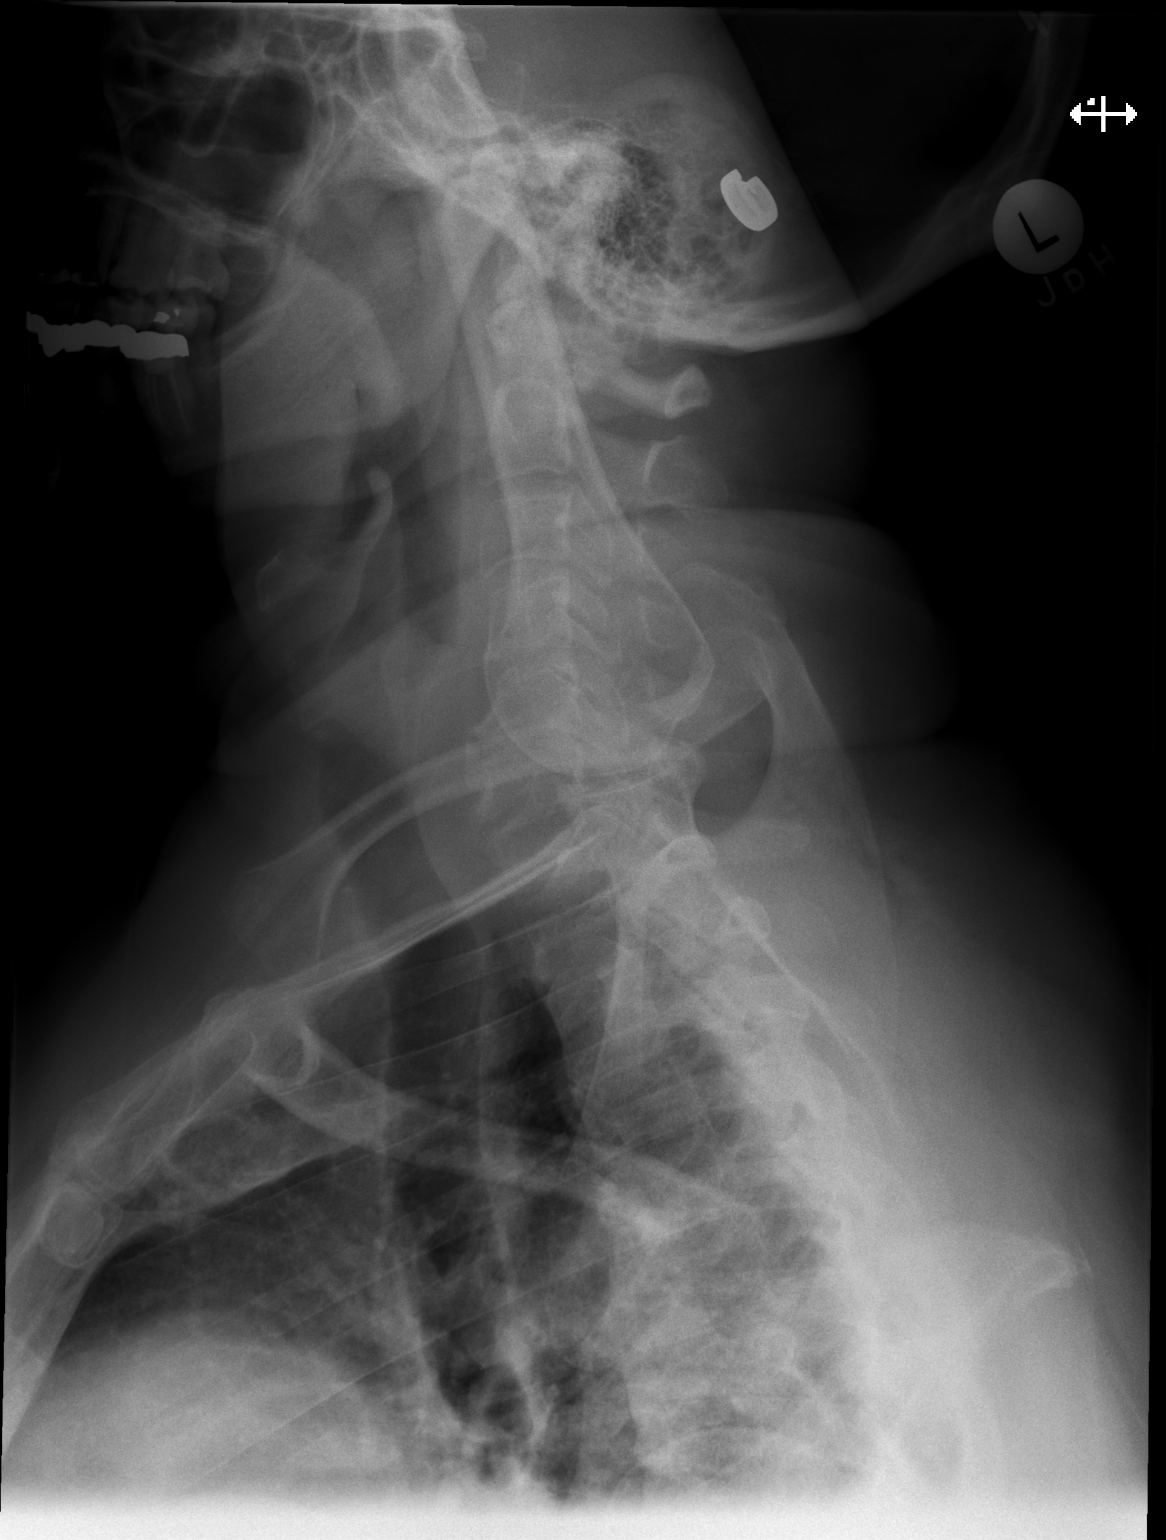

[1 of 1 positions shown; findings below may reference images not displayed]

FINDINGS: Mild thoracolumbar spine S shaped scoliosis. Diffuse multilevel
degenerative change. Ankylosis of the thoracic spine can not be
excluded. This suggest ankylosing spondylitis. Rounded density noted
over a midthoracic vertebral body at approximately the T8 level.
Although a focal bony lesion can not be excluded, this is most
likely and overlapping structure such as a pulmonary vascular
structure. Pulmonary nodule can not be excluded. PA and lateral
chest x-ray suggested for further evaluation.
IMPRESSION: 1. Mild thoracolumbar spine S-shaped scoliosis with diffuse
multilevel degenerative change. Ankylosis of the thoracic spine can
not be excluded. This suggest ankylosing spondylitis.

2. Rounded density noted over midthoracic vertebral body at
approximately the T8 level. Although focal bony lesion can not be
excluded. This is most likely overlapping structures such as a
pulmonary vascular structure. Pulmonary nodule can not be excluded.
PA and lateral chest x-ray suggested for further evaluation.
